# Patient Record
Sex: Male | Born: 1970 | Race: White | Hispanic: Yes | Marital: Married | Smoking: Never smoker
Health system: Southern US, Community
[De-identification: ages and names within clinical notes are randomized; demographics above are authoritative.]

## PROBLEM LIST (undated history)

## (undated) DIAGNOSIS — K219 Gastro-esophageal reflux disease without esophagitis: Secondary | ICD-10-CM

## (undated) DIAGNOSIS — K604 Rectal fistula, unspecified: Secondary | ICD-10-CM

## (undated) DIAGNOSIS — K851 Biliary acute pancreatitis without necrosis or infection: Secondary | ICD-10-CM

## (undated) DIAGNOSIS — Z8719 Personal history of other diseases of the digestive system: Secondary | ICD-10-CM

## (undated) DIAGNOSIS — K921 Melena: Secondary | ICD-10-CM

## (undated) DIAGNOSIS — J45909 Unspecified asthma, uncomplicated: Secondary | ICD-10-CM

## (undated) HISTORY — PX: TONSILLECTOMY: SUR1361

## (undated) HISTORY — DX: Melena: K92.1

## (undated) HISTORY — DX: Gastro-esophageal reflux disease without esophagitis: K21.9

## (undated) HISTORY — DX: Biliary acute pancreatitis without necrosis or infection: K85.10

## (undated) HISTORY — DX: Personal history of other diseases of the digestive system: Z87.19

## (undated) HISTORY — DX: Unspecified asthma, uncomplicated: J45.909

---

## 2003-10-01 DIAGNOSIS — Z8719 Personal history of other diseases of the digestive system: Secondary | ICD-10-CM

## 2003-10-01 HISTORY — DX: Personal history of other diseases of the digestive system: Z87.19

## 2009-04-18 ENCOUNTER — Emergency Department (HOSPITAL_COMMUNITY): Admission: EM | Admit: 2009-04-18 | Discharge: 2009-04-18 | Payer: Self-pay | Admitting: Emergency Medicine

## 2009-04-22 ENCOUNTER — Ambulatory Visit (HOSPITAL_COMMUNITY): Admission: RE | Admit: 2009-04-22 | Discharge: 2009-04-23 | Payer: Self-pay | Admitting: Orthopedic Surgery

## 2009-09-30 HISTORY — PX: WRIST FRACTURE SURGERY: SHX121

## 2010-08-09 LAB — TESTOSTERONE: Testosterone, total: 524.55

## 2010-08-09 LAB — TSH: TSH: 1.574

## 2010-08-09 LAB — CBC
HGB: 16.8 g/dL
WBC: 6.1
platelet count: 328

## 2011-01-06 LAB — CBC
HCT: 44.4 % (ref 39.0–52.0)
Hemoglobin: 15.8 g/dL (ref 13.0–17.0)
MCHC: 35.7 g/dL (ref 30.0–36.0)
RDW: 12.4 % (ref 11.5–15.5)

## 2011-02-12 NOTE — Op Note (Signed)
Marcus [name], Marcus [name] NO.:  [id number]   MEDICAL RECORD NO.:  [id number]          PATIENT TYPE:  OIB   LOCATION:  5004                         FACILITY:  [facility]   PHYSICIAN:  Dionne Ano. Gramig, M.D.DATE OF BIRTH:  [birth year]   DATE OF PROCEDURE:  04/22/2009  DATE OF DISCHARGE:                               OPERATIVE REPORT   PREOPERATIVE DIAGNOSIS:  Greater than three-part distal radius fracture,  left upper extremity with displacement.   POSTOPERATIVE DIAGNOSIS:  Greater than three-part distal radius  fracture, left upper extremity with displacement.   PROCEDURES:  1. Left wrist open reduction and internal fixation with distal radius      volar plate and screw construct.  2. Brachioradialis tenotomy.  3. Stress radiography.   SURGEON:  Dionne Ano. Amanda Pea, MD   ASSISTANT:  None.   COMPLICATIONS:  None.   ANESTHESIA:  General.   TOURNIQUET TIME:  Less than an hour.   DRAINS:  One.   ESTIMATED BLOOD LOSS:  Minimal.   INDICATIONS FOR PROCEDURE:  This patient is a pleasant male presents  with the above-mentioned diagnosis.  I have counseled him in regards to  risks and benefits of the surgery including risk of infection, bleeding,  anesthesia, damage to normal structures, and failure of surgery, to  accomplish its intended goals of relieving symptoms and restoring  function.  With this in mind, he decided to proceed.  All questions have  been encouraged and answered preoperatively.   OPERATIVE PROCEDURE:  The patient was seen by myself and Anesthesia,  taken to the operative suite, underwent smooth induction of anesthesia,  time-out was called, preop checklist accomplished.  He was prepped and  draped in the usual sterile fashion with Betadine scrub and paint about  the left upper extremity, this was a 10-minute scrub.  Once this was  done, outline marks were made, sterile field was secured, and a volar  radial approach to the wrist was made.   Following this, FCR tendon and  sheath was incised proximally and distally, and the patient then  underwent incision about the pronator quadratus, it was swept in a  radial to ulnar direction.  Fracture site was exposed, preliminarily  reduced, and following this, application of a DVR plate and screw  construct was accomplished.  I was able to achieve excellent radial  height, volar tilt and length without complicating feature.  The patient  tolerated this quite well.  A standard AO technique was used for the  placement.  Once this was done, the patient then underwent a very  careful and cautious irrigation and tourniquet deflation.  Excellent  hemostasis was obtained/secured with bipolar electrocautery as it was  during the initial dissection.  Pronator was repaired very nicely over  the plate.  I see a good robust tissue and a nice patulous sleeve.  Following this, TLS drain was placed and the wound was then closed in  layers of 3-0 Vicryl followed by a Prolene.  A 20-25 mL of Sensorcaine  was placed in the wound for postoperative analgesia.  Soft compartments,  no  complicating feature, excellent refill, and was placed in a short-arm  splint.  The distal radioulnar joint was identified and noted to be  stable.  The wrist underwent a full range of motion during the course of  the operation under live fluoro, and I did perform a sliding  brachioradialis tenotomy to make sure that the patient did not have any  deforming force against the radial styloid.   The patient will be admitted for IV antibiotics, general postop  observation, pain management.  Return to see me in the office in 10 days  after discharge for standard postop algorithm, status post distal radius  ORIF with plate and screw construct.  We will plan for removable splint  in 4 weeks, 8 weeks strengthening.  All questions have been encouraged  and answered.      Dionne Ano. Amanda Pea, M.D.  Electronically Signed      WMG/MEDQ  D:  04/22/2009  T:  04/22/2009  Job:  [id number]

## 2013-11-19 ENCOUNTER — Encounter: Payer: Self-pay | Admitting: Family Medicine

## 2013-11-19 ENCOUNTER — Ambulatory Visit (INDEPENDENT_AMBULATORY_CARE_PROVIDER_SITE_OTHER): Payer: 59 | Admitting: Family Medicine

## 2013-11-19 VITALS — BP 110/70 | HR 64 | Temp 98.0°F | Ht 67.5 in | Wt 165.8 lb

## 2013-11-19 DIAGNOSIS — K603 Anal fistula, unspecified: Secondary | ICD-10-CM

## 2013-11-19 DIAGNOSIS — Z23 Encounter for immunization: Secondary | ICD-10-CM

## 2013-11-19 DIAGNOSIS — Z0000 Encounter for general adult medical examination without abnormal findings: Secondary | ICD-10-CM

## 2013-11-19 DIAGNOSIS — Z8719 Personal history of other diseases of the digestive system: Secondary | ICD-10-CM | POA: Insufficient documentation

## 2013-11-19 LAB — LIPID PANEL
CHOLESTEROL: 176 mg/dL (ref 0–200)
HDL: 43.1 mg/dL (ref >39.00)
LDL CALC: 122 mg/dL — AB (ref 0–99)
Total CHOL/HDL Ratio: 4
Triglycerides: 56 mg/dL (ref 0.0–149.0)
VLDL: 11.2 mg/dL (ref 0.0–40.0)

## 2013-11-19 LAB — BASIC METABOLIC PANEL
BUN: 14 mg/dL (ref 6–23)
CALCIUM: 9.4 mg/dL (ref 8.4–10.5)
CO2: 25 mEq/L (ref 19–32)
CREATININE: 1 mg/dL (ref 0.4–1.5)
Chloride: 105 mEq/L (ref 96–112)
GFR: 92.74 mL/min (ref >60.00)
GLUCOSE: 85 mg/dL (ref 70–99)
Potassium: 3.9 mEq/L (ref 3.5–5.1)
Sodium: 139 mEq/L (ref 135–145)

## 2013-11-19 NOTE — Progress Notes (Signed)
BP 110/70  Pulse 64  Temp(Src) 98 F (36.7 C) (Oral)  Ht 5' 7.5" (1.715 m)  Wt 165 lb 12 oz (75.184 kg)  BMI 25.56 kg/m2   CC: new pt to establish  Subjective:    Patient ID: Marcus [name], male    DOB: [birth year], 43 y.o.   MRN: [id number]  HPI: Marcus [name] is a 43 y.o. male presenting on 11/19/2013 with Marcus [name] presents to establish today.  His wife and son see me as well.  Would like blood work today.  From [place], in [place] for last 7 years. Has stress at work.  Works with [place] about peri anal infection.  Ongoing for the last year.  Gets worse with sedentary activity - does lots of traveling (driving and flying).  Comes to a head and drains pus intermittently (2-3 times in last few months).  Preventative: Flu shot - today Tetanus - unsure but thinks within last 5 years  Spanish speaking, from [place] Lives with wife and 2 sons and [place] From [place], [place].   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 Allergies and medications reviewed and updated. No current outpatient prescriptions on file prior to visit.   No current facility-administered medications on file prior to visit.    Review of Systems  Constitutional: Negative for fever, chills, activity change, appetite change, fatigue and unexpected weight change.  HENT: Negative for hearing loss.   Eyes: Negative for visual disturbance.  Respiratory: Negative for cough, chest tightness, shortness of breath and wheezing.   Cardiovascular: Negative for chest pain, palpitations and leg swelling.  Gastrointestinal: Negative for nausea, vomiting, abdominal pain, diarrhea, constipation, blood in stool and abdominal distention.  Genitourinary: Negative for hematuria and difficulty urinating.    Musculoskeletal: Negative for arthralgias, myalgias and neck pain.  Skin: Negative for rash.  Neurological: Negative for dizziness, seizures, syncope and headaches.  Hematological: Negative for adenopathy. Does not bruise/bleed easily.  Psychiatric/Behavioral: Negative for dysphoric mood. The patient is nervous/anxious (due to work stress).    Per HPI unless specifically indicated above    Objective:    BP 110/70  Pulse 64  Temp(Src) 98 F (36.7 C) (Oral)  Ht 5' 7.5" (1.715 m)  Wt 165 lb 12 oz (75.184 kg)  BMI 25.56 kg/m2  Physical Exam  Nursing note and vitals reviewed. Constitutional: He is oriented to person, place, and time. He appears well-developed and well-nourished. No distress.  HENT:  Head: Normocephalic and atraumatic.  Right Ear: Hearing, tympanic membrane, external ear and ear canal normal.  Left Ear: Hearing, tympanic membrane, external ear and ear canal normal.  Nose: Nose normal.  Mouth/Throat: Uvula is midline, oropharynx is clear and moist and mucous membranes are normal. No oropharyngeal exudate, posterior oropharyngeal edema or posterior oropharyngeal erythema.  Eyes: Conjunctivae and EOM are normal. Pupils are equal, round, and reactive to light. No scleral icterus.  Neck: Normal range of motion. Neck supple.  Cardiovascular: Normal rate, regular rhythm, normal heart sounds and intact distal pulses.   No murmur heard. Pulses:      Radial pulses are 2+ on the right side, and 2+ on the left side.  Pulmonary/Chest: Effort normal and breath sounds normal. No respiratory distress. He has no wheezes. He has no rales.  Abdominal: Soft. Bowel sounds are normal. He exhibits no distension and no  mass. There is no tenderness. There is no rebound and no guarding.  Genitourinary: Rectal exam shows no external hemorrhoid and no fissure.  Posterior to anal ridge is small red papule ?angioma, inferior to this is small sinus tract opening, no drainage expressed, nontender to  palpation  Musculoskeletal: Normal range of motion. He exhibits no edema.  Lymphadenopathy:    He has no cervical adenopathy.  Neurological: He is alert and oriented to person, place, and time.  CN grossly intact, station and gait intact  Skin: Skin is warm and dry. No rash noted.  R thumb with several warts on palmar finger tip  Psychiatric: He has a normal mood and affect. His behavior is normal. Judgment and thought content normal.       Assessment & Plan:   Problem List Items Addressed This Visit   Health maintenance examination - Primary     Preventative protocols reviewed and updated unless pt declined. Discussed healthy diet and lifestyle. Flu shot today. Pt will check on immunization records at home and we will update as needed given his frequent travel for work. Pt to go to [place] this afternoon.    Relevant Orders      Lipid panel      Basic metabolic panel   History of duodenal ulcer     Controls with diet and prn prilosec otc.    Perianal fistula     No evidence of active infection/inflammation currently, but there is evidence of a sinus tract on exam today. Will refer to surg for further eval on non urgent basis.    Relevant Orders      Ambulatory referral to General Surgery       Follow up plan: Return in about 1 year (around 11/19/2014), or as needed, for physical.

## 2013-11-19 NOTE — Patient Instructions (Signed)
Pass by Marion's office to schedule eval with surgery in next few weeks (on return from [place]). Gusto verlo hoy.  llamenos con pregunta. Flu shot hoy. sangre para revisar colesterol y Immunologist. dejeme saber si parece que infeccion este regresando

## 2013-11-19 NOTE — Progress Notes (Signed)
Pre visit review using our clinic review tool, if applicable. No additional management support is needed unless otherwise documented below in the visit note. 

## 2013-11-19 NOTE — Assessment & Plan Note (Signed)
No evidence of active infection/inflammation currently, but there is evidence of a sinus tract on exam today. Will refer to surg for further eval on non urgent basis.

## 2013-11-19 NOTE — Assessment & Plan Note (Signed)
Controls with diet and prn prilosec otc.

## 2013-11-19 NOTE — Assessment & Plan Note (Signed)
Preventative protocols reviewed and updated unless pt declined. Discussed healthy diet and lifestyle. Flu shot today. Pt will check on immunization records at home and we will update as needed given his frequent travel for work. Pt to go to [place] this afternoon.

## 2013-11-22 ENCOUNTER — Encounter: Payer: Self-pay | Admitting: *Deleted

## 2013-12-15 ENCOUNTER — Ambulatory Visit (INDEPENDENT_AMBULATORY_CARE_PROVIDER_SITE_OTHER): Payer: 59 | Admitting: General Surgery

## 2013-12-15 ENCOUNTER — Encounter (INDEPENDENT_AMBULATORY_CARE_PROVIDER_SITE_OTHER): Payer: Self-pay | Admitting: General Surgery

## 2013-12-15 VITALS — BP 126/80 | HR 78 | Temp 98.6°F | Resp 14 | Ht 67.0 in | Wt 169.0 lb

## 2013-12-15 DIAGNOSIS — K603 Anal fistula, unspecified: Secondary | ICD-10-CM

## 2013-12-15 NOTE — Patient Instructions (Signed)

## 2013-12-15 NOTE — Progress Notes (Signed)
No chief complaint on file.   HISTORY: Marcus [name] is a 43 y.o. male who presents to the office with occasional perianal infections.  Other symptoms include anal pain.  This had been occurring for  8-9 months.  He has tried nothing in the past.  Nothing makes the symptoms worse.   It is intermittent in nature.  His bowel habits are regular and his bowel movements are soft.  His fiber intake is dietary.  He has never had a colonoscopy.     Past Medical History  Diagnosis Date  . Wrist fracture, left 2011  . History of duodenal ulcer 2005    worse with stress  . Asthma       Past Surgical History  Procedure Laterality Date  . Wrist fracture surgery  2011    hardware (pins)  . Tonsillectomy  1980 (?)        No current outpatient prescriptions on file.   No current facility-administered medications for this visit.      No Known Allergies    Family History  Problem Relation Age of Onset  . Diabetes Maternal Grandmother   . CAD Maternal Grandmother   . Cancer Neg Hx   . Stroke Neg Hx     History   Social History  . Marital Status: Married    Spouse Name: N/A    Number of Children: N/A  . Years of Education: N/A   Social History Main Topics  . Smoking status: Never Smoker   . Smokeless tobacco: Never Used  . Alcohol Use: Yes     Comment: Occasional  . Drug Use: No  . Sexual Activity: None   Other Topics Concern  . None   Social History Narrative   Spanish speaking, from [place]   Lives with wife and 2 sons and [place]   From [place], [place].     Occupation: works with [employer]   Edu: some college Administrator)   Activity: 2x/wk - trail running   Diet: good water, fruits/vegetables daily      [place] - PERTINENT POSITIVES ONLY: Review of Systems - General ROS: negative for - chills, fever or weight loss Hematological and Lymphatic ROS: negative for - bleeding problems, blood clots or bruising Respiratory ROS: no cough, shortness of  breath, or wheezing Cardiovascular ROS: no chest pain or dyspnea on exertion Gastrointestinal ROS: no abdominal pain, change in bowel habits, or black or bloody stools Genito-Urinary ROS: no dysuria, trouble voiding, or hematuria  EXAM: Filed Vitals:   12/15/13 0849  BP: 126/80  Pulse: 78  Temp: 98.6 F (37 C)  Resp: 14    General appearance: alert and cooperative Resp: clear to auscultation bilaterally Cardio: regular rate and rhythm GI: normal findings: soft, non-tender  Anal Exam Findings: posterior mass and possible external opening, DRE reveals a possible posterior mass in distal anal canal that could be internal opening    ASSESSMENT AND PLAN: Marcus [name] Is a 43 year old male who has signs and 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  I have recommended anal exam under anesthesia with possible fistulotomy versus seton placement. We discussed the risks of fistulotomy including minimal risk of anal incontinence. All questions were answered. I believe he understands the risk of the procedure and has agreed to proceed.    Rosario Adie, MD Colon and Rectal Surgery / [facility]      Visit Diagnoses: No diagnosis found.  Primary Care Physician: Garlon Hatchet  Danise Mina, MD

## 2014-01-04 ENCOUNTER — Ambulatory Visit (INDEPENDENT_AMBULATORY_CARE_PROVIDER_SITE_OTHER): Payer: 59 | Admitting: Family Medicine

## 2014-01-04 ENCOUNTER — Encounter: Payer: Self-pay | Admitting: Family Medicine

## 2014-01-04 VITALS — BP 98/62 | HR 52 | Temp 98.0°F | Wt 168.0 lb

## 2014-01-04 DIAGNOSIS — K8050 Calculus of bile duct without cholangitis or cholecystitis without obstruction: Secondary | ICD-10-CM

## 2014-01-04 DIAGNOSIS — K8020 Calculus of gallbladder without cholecystitis without obstruction: Secondary | ICD-10-CM

## 2014-01-04 DIAGNOSIS — K8010 Calculus of gallbladder with chronic cholecystitis without obstruction: Secondary | ICD-10-CM | POA: Insufficient documentation

## 2014-01-04 MED ORDER — OMEPRAZOLE 40 MG PO CPDR: 40.0000 mg | DELAYED_RELEASE_CAPSULE | Freq: Every day | ORAL | Status: DC

## 2014-01-04 NOTE — Progress Notes (Signed)
Pre visit review using our clinic review tool, if applicable. No additional management support is needed unless otherwise documented below in the visit note. 

## 2014-01-04 NOTE — Progress Notes (Signed)
   BP 98/62  Pulse 52  Temp(Src) 98 F (36.7 C) (Oral)  Wt 168 lb (76.204 kg)  SpO2 97%   CC: vomiting  Subjective:    Patient ID: Marcus Marcus [name], male    DOB: July 16, 1971, 43 y.o.   MRN: [id number]  HPI: Marcus Marcus [name] is a 42 y.o. male presenting on 01/04/2014 for Abdominal Pain   [name] presents today to discuss worsening abdominal discomfort associated with vomiting.  Increased stress at work.  Notes epigastric abdominal discomfort with increased stress.  Describes epigastric and some RUQ sharp crescendo/decrescendo discomfort over several hours.  Some regurgitation.  Has had 3 separate episodes of abdominal pain leading to nausea/vomiting - all at night.  Last episode had rice and fried fish prior.  Diet does have fried greasy foods.  Never really food related discomfort.  When he has abd pain flare, does change diet. No fever, jaundice, diarrhea, constipation.  He does use omeprazole OTC 20mg  and ranitidine 150mg  to improve abdominal discomfort.    Known h/o perirectal fistula has Marcus [name] colorectal surgeon, has not f/u with them for fistulogram.  Leaning towards not having surgery - worried about damage to sphincter. Known h/o duodenal ulcer dx by EGD (2000s in [place]) per pt tends to flare up with increased stress.  BP Readings from Last 3 Encounters:  01/04/14 98/62  12/15/13 126/80  11/19/13 110/70    Relevant past medical, surgical, family and social history reviewed and updated as indicated.  Allergies and medications reviewed and updated. No current outpatient prescriptions on file prior to visit.   No current facility-administered medications on file prior to visit.    Review of Systems Per HPI unless specifically indicated above    Objective:    BP 98/62  Pulse 52  Temp(Src) 98 F (36.7 C) (Oral)  Wt 168 lb (76.204 kg)  SpO2 97%  Physical Exam  Nursing note and vitals reviewed. Constitutional: He appears well-developed and well-nourished. No distress.  HENT:    Mouth/Throat: Oropharynx is clear and moist. No oropharyngeal exudate.  Cardiovascular: Normal rate, regular rhythm, normal heart sounds and intact distal pulses.   No murmur heard. Pulmonary/Chest: Effort normal and breath sounds normal. No respiratory distress. He has no wheezes. He has no rales.  Abdominal: Soft. Normal appearance and bowel sounds are normal. He exhibits no distension and no mass. There is no hepatosplenomegaly. There is no tenderness. There is no rigidity, no rebound, no guarding and negative Murphy's sign.  Skin: Skin is warm and dry. No rash noted.       Assessment & Plan:   Problem List Items Addressed This Visit   Biliary colic - Primary     Episodic RUQ and epigastric pain associated with nausea/vomiting sound consistent with biliary colic. Will obtain RUQ [place] and possibly refer to gen surgery.  Discussed possible blood work, possible HIDA scan if needed. Given h/o duodenal ulcer, will also start omerpazole 40mg  daily but sxs more consistent with biliary colic.    Relevant Orders      US Abdomen Limited RUQ       Follow up plan: Return as needed.

## 2014-01-04 NOTE — Patient Instructions (Addendum)
Suena que tiene problema de calculo en la vesicula biliar - sonograma ordenado hoy. Para posible ulcera - empieze omeprazole 40mg  diarios por 3 semanas y luego como lo necesite.  Clico biliar (Biliary Colic)  El clico biliar es un dolor continuo o irregular en la zona superior del abdomen. Generalmente se ubica debajo de la zona derecha de la caja torcica. Aparece cuando los clculos biliares interfieren con el flujo normal de la bilis que proviene de la vescula. La bilis es un lquido que interviene en la digestin de las [place]. Se produce en el hgado y se almacena en la vescula. Al comer, La bilis pasa desde la vescula, a travs del conducto cstico y el conducto biliar comn al intestino delgado. All se mezcla con la comida parcialmente digerida. Si un clculo obstruye alguno de esos conductos, se detiene el flujo normal de bilis. Las clulas del conducto biliar se contraen con fuerza para mover el clculo. Esto causa el dolor del clico biliar.  SNTOMAS  El paciente se queja de dolor en la zona superior del abdomen. El dolor puede ser:  En el centro de la zona superior del abdomen, justo por debajo del esternn.  En la zona superior derecha del abdomen, donde se encuentra la vescula biliar y el hgado.  Se expande hacia la espalda, hacia el omplato derecho.  Nuseas y vmitos  El dolor comienza generalmente despus de comer.  El clico biliar aparece como una demanda de bilis por parte del sistema digestivo. La demanda de bilis es mayor luego de ingerir alimentos ricos en grasas. Los sntomas tambin Geophysicist/field seismologist que han estado ayunando e ingieren abruptamente una comida abundante. La mayora de los episodios de clico biliar mejoran luego de 1 a 5 horas. Despus que se alivia el dolor ms intenso, podr seguir sintiendo un dolor moderado en el abdomen durante un lapso de 24 horas. DIAGNSTICO Luego de escuchar la descripcin de los sntomas, el mdico realizar  un examen fsico. Deber prestar atencin a la zona superior del abdomen. Esta es la zona en la que se encuentra el hgado y la vescula biliar. El mdico podr observar los clculos a travs de una ecografa. Tambin le realizaran un escaneo especializado de la vescula biliar. Le indicarn anlisis de [place], especialmente si tiene fiebre o el dolor persiste. PREVENCIN El clico biliar puede evitarse controlando los factores de riesgo que favorecen los clculos. Algunos de [employer] factores de riesgo como la herencia, el aumento de la edad y Water quality scientist son aspectos normales de la vida. La obesidad y [place] dieta rica en grasas son factores de riesgo que usted puede modificar a travs de cambios hacia un estilo de vida saludable. Las mujeres que atraviesan la menopausia y que reciben terapia de reemplazo hormonal (estrgenos) tambin tienen ms riesgo de Actor clicos biliares. TRATAMIENTO  Le prescribirn analgsicos.  Le indicarn una dieta sin grasas.  Si el primer episodio es intenso, o si los clicos aparecen nuevamente, generalmente se indica la ciruga para extirpar la vescula (colecistectoma). Este procedimiento puede realizarse a travs de pequeas incisiones utilizando un instrumento denominado laparoscopio. Muchas veces se requiere una breve estada en el hospital. Algunas personas reciben el alta el mismo da. Es el tratamiento ms ampliamente utilizado en personas que sufren dolor por clculos biliares. Es efectivo y seguro, no tiene complicaciones en ms del 90% de los [place].  Si la ciruga no puede llevarse a cabo, podrn utilizarse medicamentos para [employer] clculos. Esta medicacin es cara  y puede demorar meses o aos hasta que tenga [place]. Slo podr disolver clculos pequeos.  En algunos casos raros, se combinan estos medicamentos con un procedimiento denominado litotricia por ondas de choque. Este procedimiento [place] ondas de choque cuidadosamente dirigidas a romper los  clculos. En muchas personas tratadas con este procedimiento, los clculos vuelven a formarse luego de [employer]. PRONSTICO Si los clculos obstruyen el conducto cstico o conducto biliar comn, usted tiene el riesgo de sufrir episodios repetidos de clicos biliares. Tambin existe un 25% de probabilidades de desarrollar una infeccin de la vescula biliar (colecistitisaguda) o alguna otra complicacin en los siguientes 10 a 20 aos. Si ha sido sometido a [place], progrmela para el momento en que sea conveniente para usted, y para cuando no se encuentre enfermo. INSTRUCCIONES PARA EL CUIDADO DOMICILIARIO  Beba gran cantidad de lquidos claros.  Evite las comidas con mucha grasa o fritas, o cualquier alimento que empeore su dolor.  Tome los medicamentos como se le indic. SOLICITE ATENCIN MDICA SI:  Le sube la temperatura a ms de 100.5 F (38.1 C).  El dolor empeora con el [place].  Siente nuseas y [employer] comer y beber.  Tiene vmitos. SOLICITE ATENCIN MDICA DE INMEDIATO SI:  Siente dolor continuo e intenso en el abdomen, que no se alivia con medicamentos.  Siente nuseas y vmitos que no mejoran con medicamentos.  Tiene sntomas de clico biliar y comienza a Irene Shipper y escalofros. Esto puede ser un indicio de que ha desarrollado colecistitis. Comunquese con su mdico inmediatamente.  Su piel o la parte blanca del ojo se vuelven amarillas (ictericia). Document Released: 12/24/2007 Document Revised: 12/09/2011 [facility] Patient Information 2014 [place], [place].

## 2014-01-04 NOTE — Assessment & Plan Note (Signed)
Episodic RUQ and epigastric pain associated with nausea/vomiting sound consistent with biliary colic. Will obtain RUQ [place] and possibly refer to gen surgery.  Discussed possible blood work, possible HIDA scan if needed. Given h/o duodenal ulcer, will also start omerpazole 40mg  daily but sxs more consistent with biliary colic.

## 2014-01-10 ENCOUNTER — Ambulatory Visit: Payer: Self-pay | Admitting: Family Medicine

## 2014-01-10 ENCOUNTER — Encounter: Payer: Self-pay | Admitting: Family Medicine

## 2014-01-10 ENCOUNTER — Other Ambulatory Visit: Payer: Self-pay | Admitting: Family Medicine

## 2014-01-10 DIAGNOSIS — K8020 Calculus of gallbladder without cholecystitis without obstruction: Secondary | ICD-10-CM

## 2014-01-10 DIAGNOSIS — K8050 Calculus of bile duct without cholangitis or cholecystitis without obstruction: Secondary | ICD-10-CM

## 2014-01-14 ENCOUNTER — Emergency Department: Payer: Self-pay | Admitting: Emergency Medicine

## 2014-01-14 LAB — COMPREHENSIVE METABOLIC PANEL
ALBUMIN: 4.2 g/dL (ref 3.4–5.0)
ALT: 73 U/L (ref 12–78)
Alkaline Phosphatase: 77 U/L
Anion Gap: 6 — ABNORMAL LOW (ref 7–16)
BUN: 14 mg/dL (ref 7–18)
Bilirubin,Total: 0.5 mg/dL (ref 0.2–1.0)
CO2: 27 mmol/L (ref 21–32)
Calcium, Total: 8.6 mg/dL (ref 8.5–10.1)
Chloride: 106 mmol/L (ref 98–107)
Creatinine: 1 mg/dL (ref 0.60–1.30)
Glucose: 124 mg/dL — ABNORMAL HIGH (ref 65–99)
Osmolality: 279 (ref 275–301)
Potassium: 3.4 mmol/L — ABNORMAL LOW (ref 3.5–5.1)
SGOT(AST): 92 U/L — ABNORMAL HIGH (ref 15–37)
SODIUM: 139 mmol/L (ref 136–145)
Total Protein: 7.8 g/dL (ref 6.4–8.2)

## 2014-01-14 LAB — URINALYSIS, COMPLETE
BACTERIA: NONE SEEN
Bilirubin,UR: NEGATIVE
Blood: NEGATIVE
GLUCOSE, UR: NEGATIVE mg/dL (ref 0–75)
Ketone: NEGATIVE
Leukocyte Esterase: NEGATIVE
NITRITE: NEGATIVE
Ph: 6 (ref 4.5–8.0)
Protein: NEGATIVE
RBC,UR: NONE SEEN /HPF (ref 0–5)
Specific Gravity: 1.026 (ref 1.003–1.030)
Squamous Epithelial: NONE SEEN
WBC UR: <1 /HPF (ref 0–5)

## 2014-01-14 LAB — CBC WITH DIFFERENTIAL/PLATELET
BASOS ABS: 0 10*3/uL (ref 0.0–0.1)
BASOS PCT: 0.6 %
Eosinophil #: 0 10*3/uL (ref 0.0–0.7)
Eosinophil %: 0.8 %
HCT: 45.9 % (ref 40.0–52.0)
HGB: 15.4 g/dL (ref 13.0–18.0)
Lymphocyte #: 1.4 10*3/uL (ref 1.0–3.6)
Lymphocyte %: 25.6 %
MCH: 30.4 pg (ref 26.0–34.0)
MCHC: 33.6 g/dL (ref 32.0–36.0)
MCV: 91 fL (ref 80–100)
Monocyte #: 0.4 x10 3/mm (ref 0.2–1.0)
Monocyte %: 6.8 %
Neutrophil #: 3.7 10*3/uL (ref 1.4–6.5)
Neutrophil %: 66.2 %
Platelet: 245 10*3/uL (ref 150–440)
RBC: 5.06 10*6/uL (ref 4.40–5.90)
RDW: 12.6 % (ref 11.5–14.5)
WBC: 5.6 10*3/uL (ref 3.8–10.6)

## 2014-01-14 LAB — LIPASE, BLOOD: LIPASE: 244 U/L (ref 73–393)

## 2014-01-17 ENCOUNTER — Encounter: Payer: Self-pay | Admitting: *Deleted

## 2014-01-17 ENCOUNTER — Ambulatory Visit (INDEPENDENT_AMBULATORY_CARE_PROVIDER_SITE_OTHER): Payer: 59 | Admitting: Surgery

## 2014-01-17 ENCOUNTER — Telehealth (INDEPENDENT_AMBULATORY_CARE_PROVIDER_SITE_OTHER): Payer: Self-pay | Admitting: Surgery

## 2014-01-17 ENCOUNTER — Encounter (INDEPENDENT_AMBULATORY_CARE_PROVIDER_SITE_OTHER): Payer: Self-pay | Admitting: Surgery

## 2014-01-17 VITALS — BP 104/70 | HR 80 | Temp 97.6°F | Resp 14 | Ht 66.0 in | Wt 163.0 lb

## 2014-01-17 DIAGNOSIS — K8010 Calculus of gallbladder with chronic cholecystitis without obstruction: Secondary | ICD-10-CM

## 2014-01-17 DIAGNOSIS — Z8719 Personal history of other diseases of the digestive system: Secondary | ICD-10-CM

## 2014-01-17 DIAGNOSIS — K603 Anal fistula, unspecified: Secondary | ICD-10-CM

## 2014-01-17 NOTE — Patient Instructions (Signed)
Please consider the recommendations that we have given you today:  Consider surgery to laparoscopically remove your gallbladder for your recurrent episodes of gallbladder attacks.  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  See the Handout(s) we have given you.  Please call our office at [phone] if you wish to schedule surgery or if you have further questions / concerns.   Cholecystitis Cholecystitis is an inflammation of your gallbladder. It is usually caused by a buildup of gallstones or sludge (cholelithiasis) in your gallbladder. The gallbladder stores a fluid that helps digest fats (bile). Cholecystitis is serious and needs treatment right away.  CAUSES   Gallstones. Gallstones can block the tube that leads to your gallbladder, causing bile to build up. As bile builds up, the gallbladder becomes inflamed.  Bile duct problems, such as blockage from scarring or kinking.  Tumors. Tumors can stop bile from leaving your gallbladder correctly, causing bile to build up. As bile builds up, the gallbladder becomes inflamed. SYMPTOMS   Nausea.  Vomiting.  Abdominal pain, especially in the upper right area of your abdomen.  Abdominal tenderness or bloating.  Sweating.  Chills.  Fever.  Yellowing of the skin and the whites of the eyes (jaundice). DIAGNOSIS  Your caregiver may order blood tests to look for infection or gallbladder problems. Your caregiver may also order imaging tests, such as an ultrasound or computed tomography (CT) scan. Further tests may include a hepatobiliary iminodiacetic acid (HIDA) scan. This scan allows your caregiver to see your bile move from the liver to the gallbladder and to the small intestine. TREATMENT  A hospital stay is usually necessary to lessen the inflammation of your gallbladder. You may be required to not eat or drink (fast) for a certain amount of time. You may be given medicine to treat pain or an antibiotic  medicine to treat an infection. Surgery may be needed to remove your gallbladder (cholecystectomy) once the inflammation has gone down. Surgery may be needed right away if you develop complications such as death of gallbladder tissue (gangrene) or a tear (perforation) of the gallbladder.  [place] care will depend on your treatment. In general:  If you were given antibiotics, take them as directed. Finish them even if you start to feel better.  Only take over-the-counter or prescription medicines for pain, discomfort, or fever as directed by your caregiver.  Follow a low-fat diet until you see your caregiver again.  Keep all follow-up visits as directed by your caregiver. SEEK IMMEDIATE MEDICAL CARE IF:   Your pain is increasing and not controlled by medicines.  Your pain moves to another part of your abdomen or to your back.  You have a fever.  You have nausea and vomiting. MAKE SURE YOU:  Understand these instructions.  Will watch your condition.  Will get help right away if you are not doing well or get worse. Document Released: 09/16/2005 Document Revised: 12/09/2011 Document Reviewed: 08/02/2011 [facility] Patient Information 2014 [place], [place].  Anal Fistula An anal fistula is an abnormal tunnel that develops between the bowel and skin near the outside of the anus, where feces comes out. The anus has a number of tiny glands that make lubricating fluid. Sometimes these glands can become plugged and infected. This may lead to the development of a fluid-filled pocket (abscess). An anal fistula often develops after this infection or abscess. It is nearly always caused by a past or current anal abscess.  CAUSES  Though  an anal fistula is almost always caused by a past or current anal abscess, other causes can include:  A complication of surgery.  Trauma to the rectal area.  Radiation to the area.  Other medical conditions or diseases, such as:    Chronic inflammatory bowel disease, such as Crohn disease or ulcerative colitis.   Colon or rectal cancer.   Diverticular disease, such as diverticulitis.   A sexually transmitted disease, such as gonorrhea, chlamydia, or syphilis.  An HIV infection or AIDS.  SYMPTOMS   Throbbing or constant pain that may be worse when sitting.   Swelling or irritation around the anus.   Drainage of pus or blood from an opening near the anus.   Pain with bowel movements.  Fever or chills. DIAGNOSIS  Your caregiver will examine the area to find the openings of the anal fistula and the fistula tract. The external opening of the anal fistula may be seen during a physical examination. Other examinations that may be performed include:   Examination of the rectal area with a gloved hand (digital rectal exam).   Examination with a probe or scope to help locate the internal opening of the fistula.   Injection of a dye into the fistula opening. X-rays can be taken to find the exact location and path of the fistula.   An MRI or ultrasound of the anal area.  Other tests may be performed to find the cause of the anal fistula.   TREATMENT  The most common treatment for an anal fistula is surgery. There are different surgery options depending on where your fistula is located and how complex the fistula is. Surgical options include:  A fistulotomy. This surgery involves opening up the whole fistula and draining the contents inside to promote healing.  Seton placement. A silk string (seton) is placed into the fistula during a fistulotomy to drain any infection to promote healing.  Advancement flap procedure. Tissue is removed from your rectum or the skin around the anus and is attached to the opening of the fistula.  Bioprosthetic plug. A cone-shaped plug is made from your tissue and is used to block the opening of the fistula. Some anal fistulas do not require surgery. A fibrin glue is a  non-surgical option that involves injecting the glue to seal the fistula. You also may be prescribed an antibiotic medicine to treat an infection.  HOME CARE INSTRUCTIONS   Take your antibiotics as directed. Finish them even if you start to feel better.  Only take over-the-counter or prescription medicines as directed by your caregiver.Use a stool softener or laxative, if recommended.   Eat a high-fiber diet to help avoid constipation or as directed by your caregiver.  Drink enough water to keep your urine clear or pale yellow.   A warm sitz bath may be soothing and help with healing. You may take warm sitz baths for 15 20 minutes, 3 4 times a day to ease pain and discomfort.   Follow excellent hygiene to keep the anal area as clean and dry as possible. Use wet toilet paper or moist towelettes after each bowel movement.  SEEK MEDICAL CARE IF: You have increased pain not controlled with medicines.  SEEK IMMEDIATE MEDICAL CARE IF:  You have severe, intolerable pain.  You have new swelling, redness, or discharge around the anal area.  You have tenderness or warmth around the anal area.  You have chills or diarrhea.  You have severe problems urinating or having a bowel  movement.   You have a fever or persistent symptoms for more than 2 3 days.   You have a fever and your symptoms suddenly get worse.  MAKE SURE YOU:   Understand these instructions.  Will watch your condition.  Will get help right away if you are not doing well or get worse. Document Released: 08/29/2008 Document Revised: 09/02/2012 Document Reviewed: 07/22/2011 [facility] Patient Information 2014 [place].  ANORECTAL SURGERY: POST OP INSTRUCTIONS  1. Take your usually prescribed home medications unless otherwise directed. 2. DIET: Follow a light bland diet the first 24 hours after arrival home, such as soup, liquids, crackers, etc.  Be sure to include lots of fluids daily.  Avoid fast food or heavy  meals as your are more likely to get nauseated.  Eat a low fat the next few days after surgery.   3. PAIN CONTROL: a. Pain is best controlled by a usual combination of three different methods TOGETHER: i. Ice/Heat ii. Over the counter pain medication iii. Prescription pain medication b. Most patients will experience some swelling and discomfort in the anus/rectal area. and incisions.  Ice packs or heat (30-60 minutes up to 6 times a day) will help. Use ice for the first few days to help decrease swelling and bruising, then switch to heat such as warm towels, sitz baths, warm baths, etc to help relax tight/sore spots and speed recovery.  Some people prefer to use ice alone, heat alone, alternating between ice & heat.  Experiment to what works for you.  Swelling and bruising can take several weeks to resolve.   c. It is helpful to take an over-the-counter pain medication regularly for the first few weeks.  Choose one of the following that works best for you: i. Naproxen (Aleve, etc)  Two 220mg  tabs twice a day ii. Ibuprofen (Advil, etc) Three 200mg  tabs four times a day (every meal & bedtime) iii. Acetaminophen (Tylenol, etc) 500-650mg  four times a day (every meal & bedtime) d. A  prescription for pain medication (such as oxycodone, hydrocodone, etc) should be given to you upon discharge.  Take your pain medication as prescribed.  i. If you are having problems/concerns with the prescription medicine (does not control pain, nausea, vomiting, rash, itching, etc), please call us [phone] to see if we need to switch you to a different pain medicine that will work better for you and/or control your side effect better. ii. If you need a refill on your pain medication, please contact your pharmacy.  They will contact our office to request authorization. Prescriptions will not be filled after 5 pm or on week-ends.  Use a Sitz Bath 4-8 times a day for relief A sitz bath is a warm water bath taken in the  sitting position that covers only the hips and buttocks. It may be used for either healing or hygiene purposes. Sitz baths are also used to relieve pain, itching, or muscle spasms. The water may contain medicine. Moist heat will help you heal and relax.  HOME CARE INSTRUCTIONS  Take 3 to 4 sitz baths a day. 1. Fill the bathtub half full with warm water. 2. Sit in the water and open the drain a little. 3. Turn on the warm water to keep the tub half full. Keep the water running constantly. 4. Soak in the water for 15 to 20 minutes. 5. After the sitz bath, pat the affected area dry first. SEEK MEDICAL CARE IF:  You get worse instead of better. Stop  the sitz baths if you get worse.   4. KEEP YOUR BOWELS REGULAR a. The goal is one bowel movement a day b. Avoid getting constipated.  Between the surgery and the pain medications, it is common to experience some constipation.  Increasing fluid intake and taking a fiber supplement (such as Metamucil, Citrucel, FiberCon, MiraLax, etc) 1-2 times a day regularly will usually help prevent this problem from occurring.  A mild laxative (prune juice, Milk of Magnesia, MiraLax, etc) should be taken according to package directions if there are no bowel movements after 48 hours. c. Watch out for diarrhea.  If you have many loose bowel movements, simplify your diet to bland foods & liquids for a few days.  Stop any stool softeners and decrease your fiber supplement.  Switching to mild anti-diarrheal medications (Kayopectate, Pepto Bismol) can help.  If this worsens or does not improve, please call us.  5. Wound Care a. Remove your bandages the day after surgery.  Unless discharge instructions indicate otherwise, leave your bandage dry and in place overnight.  Remove the bandage during your first bowel movement.   b. Allow the wound packing to fall out over the next few days.  You can trim exposed gauze / ribbon as it falls out.  You do not need to repack the wound  unless instructed otherwise.  Wear an absorbent pad or soft cotton gauze in your underwear as needed to catch any drainage and help keep the area  c. Keep the area clean and dry.  Bathe / shower every day.  Keep the area clean by showering / bathing over the incision / wound.   It is okay to soak an open wound to help wash it.  Wet wipes or showers / gentle washing after bowel movements is often less traumatic than regular toilet paper. d. Dennis Bast may have some styrofoam-like soft packing in the rectum which will come out with the first bowel movement.  e. You will often notice bleeding with bowel movements.  This should slow down by the end of the first week of surgery f. Expect some drainage.  This should slow down, too, by the end of the first week of surgery.  Wear an absorbent pad or soft cotton gauze in your underwear until the drainage stops. 6. ACTIVITIES as tolerated:   a. You may resume regular (light) daily activities beginning the next day-such as daily self-care, walking, climbing stairs-gradually increasing activities as tolerated.  If you can walk 30 minutes without difficulty, it is safe to try more intense activity such as jogging, treadmill, bicycling, low-impact aerobics, swimming, etc. b. Save the most intensive and strenuous activity for last such as sit-ups, heavy lifting, contact sports, etc  Refrain from any heavy lifting or straining until you are off narcotics for pain control.   c. DO NOT PUSH THROUGH PAIN.  Let pain be your guide: If it hurts to do something, don't do it.  Pain is your body warning you to avoid that activity for another week until the pain goes down. d. You may drive when you are no longer taking prescription pain medication, you can comfortably sit for long periods of time, and you can safely maneuver your car and apply brakes. e. Dennis Bast may have sexual intercourse when it is comfortable.  7. FOLLOW UP in our office a. Please call [facility] at [phone] to set up an  appointment to see your surgeon in the office for a follow-up appointment approximately 2 weeks after  your surgery. b. Make sure that you call for this appointment the day you arrive home to insure a convenient appointment time. 10. IF YOU HAVE DISABILITY OR FAMILY LEAVE FORMS, BRING THEM TO THE OFFICE FOR PROCESSING.  DO NOT GIVE THEM TO YOUR DOCTOR.        WHEN TO CALL us [phone]: 1. Poor pain control 2. Reactions / problems with new medications (rash/itching, nausea, etc)  3. Fever over 101.5 F (38.5 C) 4. Inability to urinate 5. Nausea and/or vomiting 6. Worsening swelling or bruising 7. Continued bleeding from incision. 8. Increased pain, redness, or drainage from the incision  The clinic staff is available to answer your questions during regular business hours (8:30am-5pm).  Please don't hesitate to call and ask to speak to one of our nurses for clinical concerns.   A surgeon from [facility] is always on call at the hospitals   If you have a medical emergency, go to the nearest emergency room or call 911.    [facility], [place], [place], [place], [place]  [address] ? MAIN: [phone] ? TOLL FREE: [phone] ? FAX [phone] [email or web address]   LAPAROSCOPIC SURGERY: POST OP INSTRUCTIONS  8. DIET: Follow a light bland diet the first 24 hours after arrival home, such as soup, liquids, crackers, etc.  Be sure to include lots of fluids daily.  Avoid fast food or heavy meals as your are more likely to get nauseated.  Eat a low fat the next few days after surgery.   9. Take your usually prescribed home medications unless otherwise directed. 10. PAIN CONTROL: a. Pain is best controlled by a usual combination of three different methods TOGETHER: i. Ice/Heat ii. Over the counter pain medication iii. Prescription pain medication b. Most patients will experience some swelling and bruising around the  incisions.  Ice packs or heating pads (30-60 minutes up to 6 times a day) will help. Use ice for the first few days to help decrease swelling and bruising, then switch to heat to help relax tight/sore spots and speed recovery.  Some people prefer to use ice alone, heat alone, alternating between ice & heat.  Experiment to what works for you.  Swelling and bruising can take several weeks to resolve.   c. It is helpful to take an over-the-counter pain medication regularly for the first few weeks.  Choose one of the following that works best for you: i. Naproxen (Aleve, etc)  Two 220mg  tabs twice a day ii. Ibuprofen (Advil, etc) Three 200mg  tabs four times a day (every meal & bedtime) iii. Acetaminophen (Tylenol, etc) 500-650mg  four times a day (every meal & bedtime) d. A  prescription for pain medication (such as oxycodone, hydrocodone, etc) should be given to you upon discharge.  Take your pain medication as prescribed.  i. If you are having problems/concerns with the prescription medicine (does not control pain, nausea, vomiting, rash, itching, etc), please call us [phone] to see if we need to switch you to a different pain medicine that will work better for you and/or control your side effect better. ii. If you need a refill on your pain medication, please contact your pharmacy.  They will contact our office to request authorization. Prescriptions will not be filled after 5 pm or on week-ends. 11. Avoid getting constipated.  Between the surgery and the pain medications, it is common to experience some constipation.  Increasing fluid intake and taking a fiber supplement (such  as Metamucil, Citrucel, FiberCon, MiraLax, etc) 1-2 times a day regularly will usually help prevent this problem from occurring.  A mild laxative (prune juice, Milk of Magnesia, MiraLax, etc) should be taken according to package directions if there are no bowel movements after 48 hours.   12. Watch out for diarrhea.  If you have  many loose bowel movements, simplify your diet to bland foods & liquids for a few days.  Stop any stool softeners and decrease your fiber supplement.  Switching to mild anti-diarrheal medications (Kayopectate, Pepto Bismol) can help.  If this worsens or does not improve, please call us. [place] / shower every day.  You may shower over the dressings as they are waterproof.  Continue to shower over incision(s) after the dressing is off. 14. Remove your waterproof bandages 5 days after surgery.  You may leave the incision open to air.  You may replace a dressing/Band-Aid to cover the incision for comfort if you wish.  15. ACTIVITIES as tolerated:   a. You may resume regular (light) daily activities beginning the next day-such as daily self-care, walking, climbing stairs-gradually increasing activities as tolerated.  If you can walk 30 minutes without difficulty, it is safe to try more intense activity such as jogging, treadmill, bicycling, low-impact aerobics, swimming, etc. b. Save the most intensive and strenuous activity for last such as sit-ups, heavy lifting, contact sports, etc  Refrain from any heavy lifting or straining until you are off narcotics for pain control.   c. DO NOT PUSH THROUGH PAIN.  Let pain be your guide: If it hurts to do something, don't do it.  Pain is your body warning you to avoid that activity for another week until the pain goes down. d. You may drive when you are no longer taking prescription pain medication, you can comfortably wear a seatbelt, and you can safely maneuver your car and apply brakes. e. Dennis Bast may have sexual intercourse when it is comfortable.  16. FOLLOW UP in our office a. Please call [facility] at [phone] to set up an appointment to see your surgeon in the office for a follow-up appointment approximately 2-3 weeks after your surgery. b. Make sure that you call for this appointment the day you arrive home to insure a convenient appointment time. 10. IF YOU  HAVE DISABILITY OR FAMILY LEAVE FORMS, BRING THEM TO THE OFFICE FOR PROCESSING.  DO NOT GIVE THEM TO YOUR DOCTOR.   WHEN TO CALL us [phone]: 9. Poor pain control 10. Reactions / problems with new medications (rash/itching, nausea, etc)  11. Fever over 101.5 F (38.5 C) 12. Inability to urinate 13. Nausea and/or vomiting 14. Worsening swelling or bruising 15. Continued bleeding from incision. 16. Increased pain, redness, or drainage from the incision   The clinic staff is available to answer your questions during regular business hours (8:30am-5pm).  Please don't hesitate to call and ask to speak to one of our nurses for clinical concerns.   If you have a medical emergency, go to the nearest emergency room or call 911.  A surgeon from [facility] is always on call at the [facility], [place], [place], [place], [place]  [address] ? MAIN: [phone] ? TOLL FREE: [phone] ?  FAX [phone] [email or web address]

## 2014-01-17 NOTE — Telephone Encounter (Signed)
Orders on file needs to discuss deposit

## 2014-01-17 NOTE — Progress Notes (Signed)
Subjective:     Patient ID: Marcus [name], male   DOB: 05-30-1971, 43 y.o.   MRN: [id number]  HPI  Note: This dictation was prepared with Dragon/digital dictation along with [facility] technology. Any transcriptional errors that result from this process are unintentional.       Marcus [name]  Mar 17, 1971 [id number]  Patient Care Team: Ria Bush, MD as PCP - General (Family Medicine)  This patient is a 43 y.o.male who presents today for surgical evaluation at the request of Dr. Danise Mina  Reason for visit: Postprandial abdominal pain and nausea with gallstones.  Pleasant but anxious male.  Seen last month by her group to consider treatment of perirectal fistula.  Operative exploration recommended.  He is hesitating.  No incontinence to flatus or stool.  No current infection now.  Patient notes at least 5 episodes of abdominal pain.  Usually after eating a heavy meal.  Upper abdomen but mainly right-sided.  Occasionally radiating to back shoulder.  He had one episode of emesis.  Most time he gets nauseated.  He tried to switch to a low-fat diet but still struggles with pain.  He had one attack concerning enough to go to the emergency room over and [place] [facility].  His wife was concerned and had him see his primary care physician.  Ultrasounds have been done concerning for gallstones.  He does have a history of heartburn and reflux that omeprazole usually manages.  That has not stop these attacks and these symptoms are different from that.  Based on concerned, surgical consultation requested.    No personal nor family history of GI/colon cancer, inflammatory bowel disease, irritable bowel syndrome, allergy such as Celiac Sprue, dietary/dairy problems, colitis, nor gastritis.  He recalls being told he had a duodenal ulcer in the past.  He claims his job is very stressful.  No recent sick contacts/gastroenteritis.  No travel outside the country.  No changes in diet.  No dysphagia to solids  or liquids.  No significant heartburn or reflux.  No hematochezia, hematemesis, coffee ground emesis.  No evidence of prior gastric/peptic ulceration. He claims he is a "social drinker".  Sounds like no more than 2 beers a night.  No evidence of binge drinking or hepatitis or pancreatitis.  He is normally relatively active.   Patient Active Problem List   Diagnosis Date Noted  . Chronic cholecystitis with calculus 01/04/2014  . 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 Health maintenance examination 11/19/2013  . History of duodenal ulcer     Past Medical History  Diagnosis Date  . Wrist fracture, left 2011  . History of duodenal ulcer 2005    worse with stress  . Asthma     Past Surgical History  Procedure Laterality Date  . Wrist fracture surgery  2011    hardware (pins)  . Tonsillectomy  1980 (?)    History   Social History  . Marital Status: Married    Spouse Name: N/A    Number of Children: N/A  . Years of Education: N/A   Occupational History  . Not on file.   Social History Main Topics  . Smoking status: Never Smoker   . Smokeless tobacco: Never Used  . Alcohol Use: Yes     Comment: Occasional  . Drug Use: No  . Sexual Activity: Not on file   Other Topics Concern  . Not on file   Social History Narrative   Spanish speaking, from [place]   Lives with wife and 2 sons and  [place]   From [place], [place].     Occupation: works with [employer]   Edu: some college Administrator)   Activity: 2x/wk - trail running   Diet: good water, fruits/vegetables daily    Family History  Problem Relation Age of Onset  . Diabetes Maternal Grandmother   . CAD Maternal Grandmother   . Cancer Neg Hx   . Stroke Neg Hx     Current Outpatient Prescriptions  Medication Sig Dispense Refill  . omeprazole (PRILOSEC) 40 MG capsule Take 1 capsule (40 mg total) by mouth daily.  30 capsule  3  . ondansetron (ZOFRAN-ODT) 4 MG disintegrating tablet       .  oxyCODONE-acetaminophen (PERCOCET/ROXICET) 5-325 MG per tablet        No current facility-administered medications for this visit.     No Known Allergies  BP 104/70  Pulse 80  Temp(Src) 97.6 F (36.4 C) (Temporal)  Resp 14  Ht 5\' 6"  (1.676 m)  Wt 163 lb (73.936 kg)  BMI 26.32 kg/m2  No results found.   Review of Systems  Constitutional: Negative for fever, chills and diaphoresis.  HENT: Negative for ear discharge, facial swelling, mouth sores, nosebleeds, sore throat and trouble swallowing.   Eyes: Negative for photophobia, discharge and visual disturbance.  Respiratory: Negative for choking, chest tightness, shortness of breath and stridor.   Cardiovascular: Negative for chest pain and palpitations.  Gastrointestinal: Negative for nausea, vomiting, abdominal pain, diarrhea, constipation, blood in stool, abdominal distention, anal bleeding and rectal pain.  Endocrine: Negative for cold intolerance and heat intolerance.  Genitourinary: Negative for dysuria, urgency, difficulty urinating and testicular pain.  Musculoskeletal: Negative for arthralgias, back pain, gait problem and myalgias.  Skin: Negative for color change, pallor, rash and wound.  Allergic/Immunologic: Negative for environmental allergies and food allergies.  Neurological: Negative for dizziness, speech difficulty, weakness, numbness and headaches.  Hematological: Negative for adenopathy. Does not bruise/bleed easily.  Psychiatric/Behavioral: Negative for hallucinations, confusion and agitation.       Objective:   Physical Exam  Constitutional: He is oriented to person, place, and time. He appears well-developed and well-nourished. No distress.  HENT:  Head: Normocephalic.  Mouth/Throat: Oropharynx is clear and moist. No oropharyngeal exudate.  Eyes: Conjunctivae and EOM are normal. Pupils are equal, round, and reactive to light. No scleral icterus.  Neck: Normal range of motion. Neck supple. No tracheal  deviation present.  Cardiovascular: Normal rate, regular rhythm and intact distal pulses.   Pulmonary/Chest: Effort normal and breath sounds normal. No respiratory distress.  Abdominal: Soft. He exhibits no distension. There is no rigidity, no rebound, no guarding, no tenderness at McBurney's point and negative Murphy's sign. No hernia. Hernia confirmed negative in the ventral area, confirmed negative in the right inguinal area and confirmed negative in the left inguinal area.  Mild right upper quadrant discomfort  Genitourinary:  Deferred per patient request since had examination last month.  Musculoskeletal: Normal range of motion. He exhibits no tenderness.  Lymphadenopathy:    He has no cervical adenopathy.       Right: No inguinal adenopathy present.       Left: No inguinal adenopathy present.  Neurological: He is alert and oriented to person, place, and time. No cranial nerve deficit. He exhibits normal muscle tone. Coordination normal.  Skin: Skin is warm and dry. No rash noted. He is not diaphoretic. No erythema. No pallor.  Psychiatric: He has a normal mood and affect. His behavior is normal. Judgment and thought  content normal.       Assessment:     Classic story of biliary colic with repeated attacks.  Suspicious for chronic cholecystitis.  Persistent perianal fistula.     Plan:     I spent a long time reviewing things with the patient and his wife numerous times.  I think he would benefit from cholecystectomy to remove his gallbladder.  He has had at least 5 attacks with a classic story.  The rest of the differential diagnosis seems unlikely.  He was concern about risks of surgery of surgery that he read online.  He was hoping he can just avoid surgery if he states a low-fat diet.  His wife reminded him that he still has had issues:  The anatomy & physiology of hepatobiliary & pancreatic function was discussed.  The pathophysiology of gallbladder dysfunction was discussed.   Natural history risks without surgery was discussed.   I feel the risks of no intervention will lead to serious problems that outweigh the operative risks; therefore, I recommended cholecystectomy to remove the pathology.  I explained laparoscopic techniques with possible need for an open approach.  Probable cholangiogram to evaluate the bilary tract was explained as well.    Risks such as bleeding, infection, abscess, leak, injury to other organs, need for further treatment, heart attack, death, and other risks were discussed.  I noted a good likelihood this will help address the problem.  Possibility that this will not correct all abdominal symptoms was explained.  Goals of post-operative recovery were discussed as well.  We will work to minimize complications.  An educational handout further explaining the pathology and treatment options was given as well.  Questions were answered.  The patient expresses understanding & wishes to proceed with surgery.  While he is asleep, I offerred to do examination under anesthesia and address his chronic fistula.  This could be a combined case.  He been recommended to have a fistulotomy last month. I discussed with Dr. Marcello Moores who saw him for this.  The patient is afraid of fecal incontinence.  I think that risk is low in a young male with history of Crohn's & no incontinence issues in the past.  I cautioned him that the fistula would not heal without an operation.  Hopefully a superficial fistulotomy only needed but may need LIFT operation.  Possible need for a seton.  Sometimes it seemed like he wanted to get it all done at once.  Other times he was hesitant to have any surgery done.  His wife seems more convinced he needs an operation to fix things.  I did discuss with surgery would entail:  The anatomy & physiology of the anorectal region was discussed.  We discussed the pathophysiology of anorectal abscess and fistula.  Differential diagnosis was discussed.  Natural  history progression was discussed.   I stressed the importance of a bowel regimen to have daily soft bowel movements to minimize progression of disease.     The patient's condition is not adequately controlled.  Non-operative treatment has not healed the fistula.  Therefore, I recommended examination under anaesthesia to confirm the diagnosis and treat the fistula.  I discussed techniques that may be required such as fistulotomy, ligation by LIFT technique, and/or seton placement.  Benefits & alternatives discussed.  I noted a good likelihood this will help address the problem, but sometimes repeat operations and prolonged healing times may occur.  Risks such as bleeding, pain, recurrence, reoperation, incontinence, heart attack, death, and  other risks were discussed.      Educational handouts further explaining the pathology, treatment options, and bowel regimen were given.  The patient expressed understanding & wishes to proceed.  We will work to coordinate surgery for a mutually convenient time.

## 2014-01-20 ENCOUNTER — Telehealth: Payer: Self-pay | Admitting: Family Medicine

## 2014-01-20 ENCOUNTER — Encounter: Payer: Self-pay | Admitting: *Deleted

## 2014-01-20 NOTE — Telephone Encounter (Signed)
Patient walked in to ask me to help find him a Licensed conveyancer that wont make him pay the professional fees before his gallbladder surgery can be done. He saw [facility] and they wanted $1000 up front before they would set up his surgery. I set him up with Dr Bary Castilla at [facility] in [place] and they will let him  be on a payment plan which is all that the patient wants to be able to do. Appt made with patient here and Dr Bary Castilla is on Epic.

## 2014-01-23 NOTE — Telephone Encounter (Signed)
Noted. Thanks.

## 2014-02-15 ENCOUNTER — Ambulatory Visit: Payer: Self-pay | Admitting: General Surgery

## 2014-03-02 ENCOUNTER — Encounter: Payer: Self-pay | Admitting: *Deleted

## 2014-04-29 ENCOUNTER — Encounter: Payer: Self-pay | Admitting: Family Medicine

## 2014-04-29 ENCOUNTER — Ambulatory Visit (INDEPENDENT_AMBULATORY_CARE_PROVIDER_SITE_OTHER): Payer: 59 | Admitting: Family Medicine

## 2014-04-29 VITALS — BP 110/70 | HR 72 | Temp 98.5°F | Wt 162.2 lb

## 2014-04-29 DIAGNOSIS — M5430 Sciatica, unspecified side: Secondary | ICD-10-CM

## 2014-04-29 DIAGNOSIS — M5432 Sciatica, left side: Secondary | ICD-10-CM

## 2014-04-29 MED ORDER — DICLOFENAC SODIUM 75 MG PO TBEC: 75.0000 mg | DELAYED_RELEASE_TABLET | Freq: Two times a day (BID) | ORAL | Status: DC

## 2014-04-29 NOTE — Progress Notes (Signed)
Pre visit review using our clinic review tool, if applicable. No additional management support is needed unless otherwise documented below in the visit note. 

## 2014-04-29 NOTE — Progress Notes (Signed)
[place] [address] Phone: [phone] Fax: [phone]  Patient ID: Marcus [name] MRN: [id number], DOB: 11-08-1970, 43 y.o. Date of Encounter: 04/29/2014  Primary Physician:  Ria Bush, MD   Chief Complaint: Leg Pain   Subjective:   History of Present Illness:  Marcus [name] is a 43 y.o. very pleasant male patient who presents with the following:  Has been having some buttocks pain for 2 weeks. Pressure washed house 2 - 2 1/2 weeks ago. Feels when standing up.  All posterior. It is essential illness entirely in the buttocks region, and it does not extend down the front or posterior aspect of the patient's leg. He is not having any numbness or tingling. He has no significant back history problems. He is trying to be minimal at home with the exception of doing some occasional mild stretching.  Past Medical History, Surgical History, Social History, Family History, Problem List, Medications, and Allergies have been reviewed and updated if relevant.  Review of Systems:  GEN: No acute illnesses, no fevers, chills. GI: No n/v/d, eating normally Pulm: No SOB Interactive and getting along well at home.  Otherwise, ROS is as per the HPI.  Objective:   Physical Examination: BP 110/70  Pulse 72  Temp(Src) 98.5 F (36.9 C) (Oral)  Wt 162 lb 4 oz (73.596 kg)   GEN: WDWN, NAD, Non-toxic, Alert & Oriented x 3 HEENT: Atraumatic, Normocephalic.  Ears and Nose: No external deformity. EXTR: No clubbing/cyanosis/edema NEURO: Normal gait.  PSYCH: Normally interactive. Conversant. Not depressed or anxious appearing.  Calm demeanor.   Range of motion at  the waist: Flexion: normal Extension: normal Lateral bending: normal Rotation: all normal  No echymosis or edema Rises to examination table with no difficulty Gait: non antalgic  Inspection/Deformity: N Paraspinus Tenderness: none TTP in buttocks  B Ankle Dorsiflexion (L5,4): 5/5 B Great Toe  Dorsiflexion (L5,4): 5/5 Heel Walk (L5): WNL Toe Walk (S1): WNL Rise/Squat (L4): WNL  SENSORY B Medial Foot (L4): WNL B Dorsum (L5): WNL B Lateral (S1): WNL Light Touch: WNL Pinprick: WNL  REFLEXES Knee (L4): 2+ Ankle (S1): 2+  B SLR, seated: neg B SLR, supine: neg B FABER: neg B Reverse FABER: neg B Greater Troch: NT B Log Roll: neg B Stork: NT B Sciatic Notch: NT   Laboratory and Imaging Data:  Assessment & Plan:   Back pain with left-sided sciatica  Not classic sciatica but suspect referred to buttocks. Piriformis spasm, too. A rehabilitation program from the [employer] was reviewed with the patient face to face for their condition.   ROM, str for hip and LBP  No red flags. Approx 95% of these cases will be better by 6 weeks.  New Prescriptions   DICLOFENAC (VOLTAREN) 75 MG EC TABLET    Take 1 tablet (75 mg total) by mouth 2 (two) times daily.   Modified Medications   Modified Medication Previous Medication   OMEPRAZOLE (PRILOSEC) 40 MG CAPSULE omeprazole (PRILOSEC) 40 MG capsule      Take 40 mg by mouth daily as needed.    Take 1 capsule (40 mg total) by mouth daily.   Signed,  Maud Deed. Elodie Panameno, MD, CA[facility] Sports Medicine   Discontinued Medications   No medications on file   Current Medications at Discharge:   Medication List       This list is accurate as of: 04/29/14 11:59 PM.  Always use your most recent med list.  diclofenac 75 MG EC tablet  Commonly known as:  VOLTAREN  Take 1 tablet (75 mg total) by mouth 2 (two) times daily.     omeprazole 40 MG capsule  Commonly known as:  PRILOSEC  Take 40 mg by mouth daily as needed.     ondansetron 4 MG disintegrating tablet  Commonly known as:  ZOFRAN-ODT     oxyCODONE-acetaminophen 5-325 MG per tablet  Commonly known as:  PERCOCET/ROXICET

## 2014-06-07 ENCOUNTER — Emergency Department (HOSPITAL_COMMUNITY): Payer: 59

## 2014-06-07 ENCOUNTER — Encounter (HOSPITAL_COMMUNITY): Payer: Self-pay | Admitting: Emergency Medicine

## 2014-06-07 ENCOUNTER — Inpatient Hospital Stay (HOSPITAL_COMMUNITY)
Admission: EM | Admit: 2014-06-07 | Discharge: 2014-06-10 | DRG: 417 | Disposition: A | Discharge status: Home / Self Care | Payer: 59 | Attending: Internal Medicine | Admitting: Internal Medicine

## 2014-06-07 DIAGNOSIS — K859 Acute pancreatitis without necrosis or infection, unspecified: Secondary | ICD-10-CM | POA: Diagnosis present

## 2014-06-07 DIAGNOSIS — J45909 Unspecified asthma, uncomplicated: Secondary | ICD-10-CM

## 2014-06-07 DIAGNOSIS — K851 Biliary acute pancreatitis without necrosis or infection: Secondary | ICD-10-CM

## 2014-06-07 DIAGNOSIS — R7402 Elevation of levels of lactic acid dehydrogenase (LDH): Secondary | ICD-10-CM

## 2014-06-07 DIAGNOSIS — K8040 Calculus of bile duct with cholecystitis, unspecified, without obstruction: Principal | ICD-10-CM | POA: Diagnosis present

## 2014-06-07 DIAGNOSIS — Z8719 Personal history of other diseases of the digestive system: Secondary | ICD-10-CM

## 2014-06-07 DIAGNOSIS — K8050 Calculus of bile duct without cholangitis or cholecystitis without obstruction: Secondary | ICD-10-CM

## 2014-06-07 DIAGNOSIS — Z8711 Personal history of peptic ulcer disease: Secondary | ICD-10-CM | POA: Diagnosis not present

## 2014-06-07 DIAGNOSIS — R740 Nonspecific elevation of levels of transaminase and lactic acid dehydrogenase [LDH]: Secondary | ICD-10-CM

## 2014-06-07 DIAGNOSIS — R932 Abnormal findings on diagnostic imaging of liver and biliary tract: Secondary | ICD-10-CM

## 2014-06-07 DIAGNOSIS — K8010 Calculus of gallbladder with chronic cholecystitis without obstruction: Secondary | ICD-10-CM

## 2014-06-07 HISTORY — DX: Biliary acute pancreatitis without necrosis or infection: K85.10

## 2014-06-07 LAB — LIPASE, BLOOD: Lipase: >3000 U/L — ABNORMAL HIGH (ref 11–59)

## 2014-06-07 LAB — CBC WITH DIFFERENTIAL/PLATELET
BASOS PCT: 0 % (ref 0–1)
Basophils Absolute: 0 10*3/uL (ref 0.0–0.1)
EOS ABS: 0 10*3/uL (ref 0.0–0.7)
Eosinophils Relative: 0 % (ref 0–5)
HEMATOCRIT: 45.4 % (ref 39.0–52.0)
HEMOGLOBIN: 16.5 g/dL (ref 13.0–17.0)
LYMPHS ABS: 0.9 10*3/uL (ref 0.7–4.0)
Lymphocytes Relative: 7 % — ABNORMAL LOW (ref 12–46)
MCH: 31 pg (ref 26.0–34.0)
MCHC: 36.3 g/dL — AB (ref 30.0–36.0)
MCV: 85.3 fL (ref 78.0–100.0)
MONO ABS: 0.7 10*3/uL (ref 0.1–1.0)
MONOS PCT: 6 % (ref 3–12)
NEUTROS PCT: 87 % — AB (ref 43–77)
Neutro Abs: 10.3 10*3/uL — ABNORMAL HIGH (ref 1.7–7.7)
Platelets: 252 10*3/uL (ref 150–400)
RBC: 5.32 MIL/uL (ref 4.22–5.81)
RDW: 12 % (ref 11.5–15.5)
WBC: 11.9 10*3/uL — ABNORMAL HIGH (ref 4.0–10.5)

## 2014-06-07 LAB — URINALYSIS, ROUTINE W REFLEX MICROSCOPIC
Glucose, UA: NEGATIVE mg/dL
HGB URINE DIPSTICK: NEGATIVE
Ketones, ur: 15 mg/dL — AB
NITRITE: NEGATIVE
PROTEIN: NEGATIVE mg/dL
Specific Gravity, Urine: 1.02 (ref 1.005–1.030)
UROBILINOGEN UA: 1 mg/dL (ref 0.0–1.0)
pH: 7.5 (ref 5.0–8.0)

## 2014-06-07 LAB — COMPREHENSIVE METABOLIC PANEL
ALT: 640 U/L — ABNORMAL HIGH (ref 0–53)
ANION GAP: 17 — AB (ref 5–15)
AST: 390 U/L — ABNORMAL HIGH (ref 0–37)
Albumin: 4.3 g/dL (ref 3.5–5.2)
Alkaline Phosphatase: 180 U/L — ABNORMAL HIGH (ref 39–117)
BILIRUBIN TOTAL: 3 mg/dL — AB (ref 0.3–1.2)
BUN: 11 mg/dL (ref 6–23)
CHLORIDE: 96 meq/L (ref 96–112)
CO2: 22 mEq/L (ref 19–32)
Calcium: 9.6 mg/dL (ref 8.4–10.5)
Creatinine, Ser: 0.98 mg/dL (ref 0.50–1.35)
GFR calc non Af Amer: >90 mL/min (ref >90)
Glucose, Bld: 187 mg/dL — ABNORMAL HIGH (ref 70–99)
Potassium: 3.7 mEq/L (ref 3.7–5.3)
Sodium: 135 mEq/L — ABNORMAL LOW (ref 137–147)
Total Protein: 7.5 g/dL (ref 6.0–8.3)

## 2014-06-07 LAB — PROTIME-INR
INR: 1.07 (ref 0.00–1.49)
Prothrombin Time: 13.9 seconds (ref 11.6–15.2)

## 2014-06-07 LAB — APTT: aPTT: 27 seconds (ref 24–37)

## 2014-06-07 LAB — URINE MICROSCOPIC-ADD ON

## 2014-06-07 MED ORDER — SODIUM CHLORIDE 0.9 % IJ SOLN
3.0000 mL | INTRAMUSCULAR | Status: DC | PRN
Start: 2014-06-07 — End: 2014-06-10

## 2014-06-07 MED ORDER — HEPARIN SODIUM (PORCINE) 5000 UNIT/ML IJ SOLN
5000.0000 [IU] | Freq: Three times a day (TID) | INTRAMUSCULAR | Status: DC
  Administered 2014-06-07 – 2014-06-10 (×8): 5000 [IU] via SUBCUTANEOUS
  Filled 2014-06-07 (×11): qty 1

## 2014-06-07 MED ORDER — SODIUM CHLORIDE 0.9 % IV BOLUS (SEPSIS)
1000.0000 mL | Freq: Once | INTRAVENOUS | Status: AC
  Administered 2014-06-07: 1000 mL via INTRAVENOUS

## 2014-06-07 MED ORDER — SODIUM CHLORIDE 0.9 % IV SOLN
INTRAVENOUS | Status: DC
  Administered 2014-06-07 – 2014-06-08 (×4): via INTRAVENOUS

## 2014-06-07 MED ORDER — FENTANYL CITRATE 0.05 MG/ML IJ SOLN
50.0000 ug | Freq: Once | INTRAMUSCULAR | Status: AC
  Administered 2014-06-07: 50 ug via INTRAVENOUS
  Filled 2014-06-07: qty 2

## 2014-06-07 MED ORDER — ONDANSETRON HCL 4 MG/2ML IJ SOLN: 4.0000 mg | Freq: Four times a day (QID) | INTRAMUSCULAR | Status: DC | PRN

## 2014-06-07 MED ORDER — HYDROMORPHONE HCL PF 1 MG/ML IJ SOLN
1.0000 mg | Freq: Once | INTRAMUSCULAR | Status: AC
  Administered 2014-06-07: 1 mg via INTRAVENOUS
  Filled 2014-06-07: qty 1

## 2014-06-07 MED ORDER — SODIUM CHLORIDE 0.9 % IV SOLN: 250.0000 mL | INTRAVENOUS | Status: DC | PRN

## 2014-06-07 MED ORDER — SODIUM CHLORIDE 0.9 % IJ SOLN
3.0000 mL | Freq: Two times a day (BID) | INTRAMUSCULAR | Status: DC
  Administered 2014-06-07 – 2014-06-08 (×2): 3 mL via INTRAVENOUS

## 2014-06-07 MED ORDER — MORPHINE SULFATE 2 MG/ML IJ SOLN: 2.0000 mg | INTRAMUSCULAR | Status: DC | PRN

## 2014-06-07 MED ORDER — SODIUM CHLORIDE 0.9 % IV SOLN
INTRAVENOUS | Status: AC
  Administered 2014-06-07: 20:00:00 via INTRAVENOUS

## 2014-06-07 MED ORDER — ONDANSETRON HCL 4 MG/2ML IJ SOLN
4.0000 mg | Freq: Once | INTRAMUSCULAR | Status: AC
  Administered 2014-06-07: 4 mg via INTRAVENOUS
  Filled 2014-06-07: qty 2

## 2014-06-07 NOTE — ED Provider Notes (Signed)
CSN: [id number]     Arrival date & time 06/07/14  1602 History   First MD Initiated Contact with Patient 06/07/14 1701     Chief Complaint  Patient presents with  . Abdominal Pain     (Consider location/radiation/quality/duration/timing/severity/associated sxs/prior Treatment) Patient is a 43 y.o. male presenting with abdominal pain. The history is provided by the patient. No language interpreter was used.  Abdominal Pain Pain location:  Periumbilical and RUQ Pain quality: aching and sharp   Pain severity:  Moderate Onset quality:  Gradual Duration:  2 days Progression:  Waxing and waning Associated symptoms: nausea and vomiting   Associated symptoms: no chills, no cough, no diarrhea, no fever and no shortness of breath   Associated symptoms comment:  Patient with a known history of gall stones has had RUQ and periumbilical pain for the past 2 days. No fever. He has nausea with non-bloody vomiting. No chest pain or cough. He denies change in bowel movements.   Past Medical History  Diagnosis Date  . Wrist fracture, left 2011  . History of duodenal ulcer 2005    worse with stress  . Asthma    Past Surgical History  Procedure Laterality Date  . Wrist fracture surgery  2011    hardware (pins)  . Tonsillectomy  1980 (?)   Family History  Problem Relation Age of Onset  . Diabetes Maternal Grandmother   . CAD Maternal Grandmother   . Cancer Neg Hx   . Stroke Neg Hx    History  Substance Use Topics  . Smoking status: Never Smoker   . Smokeless tobacco: Never Used  . Alcohol Use: Yes     Comment: Occasional    Review of Systems  Constitutional: Negative for fever and chills.  Respiratory: Negative.  Negative for cough and shortness of breath.   Cardiovascular: Negative.   Gastrointestinal: Positive for nausea, vomiting and abdominal pain. Negative for diarrhea.  Musculoskeletal: Negative.   Skin: Negative.   Neurological: Negative.       Allergies  Review of  patient's allergies indicates no known allergies.  Home Medications   Prior to Admission medications   Medication Sig Start Date End Date Taking? Authorizing Provider  ondansetron (ZOFRAN-ODT) 4 MG disintegrating tablet Take 4 mg by mouth every 8 (eight) hours as needed for nausea.  01/14/14  Yes Historical Provider, MD  oxyCODONE-acetaminophen (PERCOCET/ROXICET) 5-325 MG per tablet Take 1 tablet by mouth every 4 (four) hours as needed for moderate pain.  01/14/14  Yes Historical Provider, MD   BP 121/87  Pulse 88  Temp(Src) 98.6 F (37 C) (Oral)  Resp 18  SpO2 100% Physical Exam  Constitutional: He is oriented to person, place, and time. He appears well-developed and well-nourished.  HENT:  Head: Normocephalic.  Neck: Normal range of motion. Neck supple.  Cardiovascular: Normal rate and regular rhythm.   Pulmonary/Chest: Effort normal and breath sounds normal.  Abdominal: Soft. Bowel sounds are normal. There is tenderness. There is guarding. There is no rebound.  RUQ tenderness to soft abdomen. Mildly distended. No peritoneal signs.   Musculoskeletal: Normal range of motion.  Neurological: He is alert and oriented to person, place, and time.  Skin: Skin is warm and dry. No rash noted.  Psychiatric: He has a normal mood and affect.    ED Course  Procedures (including critical care time) Labs Review Labs Reviewed  CBC WITH DIFFERENTIAL - Abnormal; Notable for the following:    WBC 11.9 (*)  MCHC 36.3 (*)    Neutrophils Relative % 87 (*)    Neutro Abs 10.3 (*)    Lymphocytes Relative 7 (*)    All other components within normal limits  COMPREHENSIVE METABOLIC PANEL - Abnormal; Notable for the following:    Sodium 135 (*)    Glucose, Bld 187 (*)    AST 390 (*)    ALT 640 (*)    Alkaline Phosphatase 180 (*)    Total Bilirubin 3.0 (*)    Anion gap 17 (*)    All other components within normal limits  LIPASE, BLOOD - Abnormal; Notable for the following:    Lipase >3000 (*)     All other components within normal limits  URINALYSIS, ROUTINE W REFLEX MICROSCOPIC   Results for orders placed during the hospital encounter of 06/07/14  CBC WITH DIFFERENTIAL      Result Value Ref Range   WBC 11.9 (*) 4.0 - 10.5 K/uL   RBC 5.32  4.22 - 5.81 MIL/uL   Hemoglobin 16.5  13.0 - 17.0 g/dL   HCT 45.4  39.0 - 52.0 %   MCV 85.3  78.0 - 100.0 fL   MCH 31.0  26.0 - 34.0 pg   MCHC 36.3 (*) 30.0 - 36.0 g/dL   RDW 12.0  11.5 - 15.5 %   Platelets 252  150 - 400 K/uL   Neutrophils Relative % 87 (*) 43 - 77 %   Neutro Abs 10.3 (*) 1.7 - 7.7 K/uL   Lymphocytes Relative 7 (*) 12 - 46 %   Lymphs Abs 0.9  0.7 - 4.0 K/uL   Monocytes Relative 6  3 - 12 %   Monocytes Absolute 0.7  0.1 - 1.0 K/uL   Eosinophils Relative 0  0 - 5 %   Eosinophils Absolute 0.0  0.0 - 0.7 K/uL   Basophils Relative 0  0 - 1 %   Basophils Absolute 0.0  0.0 - 0.1 K/uL  COMPREHENSIVE METABOLIC PANEL      Result Value Ref Range   Sodium 135 (*) 137 - 147 mEq/L   Potassium 3.7  3.7 - 5.3 mEq/L   Chloride 96  96 - 112 mEq/L   CO2 22  19 - 32 mEq/L   Glucose, Bld 187 (*) 70 - 99 mg/dL   BUN 11  6 - 23 mg/dL   Creatinine, Ser 0.98  0.50 - 1.35 mg/dL   Calcium 9.6  8.4 - 10.5 mg/dL   Total Protein 7.5  6.0 - 8.3 g/dL   Albumin 4.3  3.5 - 5.2 g/dL   AST 390 (*) 0 - 37 U/L   ALT 640 (*) 0 - 53 U/L   Alkaline Phosphatase 180 (*) 39 - 117 U/L   Total Bilirubin 3.0 (*) 0.3 - 1.2 mg/dL   GFR calc non Af Amer >90  >90 mL/min   GFR calc Af Amer >90  >90 mL/min   Anion gap 17 (*) 5 - 15  LIPASE, BLOOD      Result Value Ref Range   Lipase >3000 (*) 11 - 59 U/L  URINALYSIS, ROUTINE W REFLEX MICROSCOPIC      Result Value Ref Range   Color, Urine AMBER (*) YELLOW   APPearance CLEAR  CLEAR   Specific Gravity, Urine 1.020  1.005 - 1.030   pH 7.5  5.0 - 8.0   Glucose, UA NEGATIVE  NEGATIVE mg/dL   Hgb urine dipstick NEGATIVE  NEGATIVE   Bilirubin Urine SMALL (*)  NEGATIVE   Ketones, ur 15 (*) NEGATIVE mg/dL    Protein, ur NEGATIVE  NEGATIVE mg/dL   Urobilinogen, UA 1.0  0.0 - 1.0 mg/dL   Nitrite NEGATIVE  NEGATIVE   Leukocytes, UA TRACE (*) NEGATIVE  PROTIME-INR      Result Value Ref Range   Prothrombin Time 13.9  11.6 - 15.2 seconds   INR 1.07  0.00 - 1.49  APTT      Result Value Ref Range   aPTT 27  24 - 37 seconds  URINE MICROSCOPIC-ADD ON      Result Value Ref Range   Squamous Epithelial / LPF RARE  RARE   WBC, UA 0-2  <3 WBC/hpf   Bacteria, UA RARE  RARE   Urine-Other MUCOUS PRESENT     US Abdomen Complete  06/07/2014   CLINICAL DATA:  Abdomen pain, nausea and vomiting.  EXAM: ULTRASOUND ABDOMEN COMPLETE  COMPARISON:  None.  FINDINGS: Gallbladder:  Gallstones are noted the gallbladder. No wall thickening visualized. No sonographic Murphy sign noted.  Common bile duct:  Diameter: 4.5 mm  Liver:  No focal lesion identified. Within normal limits in parenchymal echogenicity.  IVC:  No abnormality visualized.  Pancreas:  Visualized portion unremarkable.  Spleen:  Size and appearance within normal limits.  Right Kidney:  Length: 12.1 cm. Echogenicity within normal limits. No mass or hydronephrosis visualized.  Left Kidney:  Length: 12.8 cm. Echogenicity within normal limits. No mass or hydronephrosis visualized.  Abdominal aorta:  No aneurysm visualized.  Other findings:  None.  IMPRESSION: Cholelithiasis without sonographic evidence of acute cholecystitis. No acute abnormality.   Electronically Signed   By: Abelardo Diesel M.D.   On: 06/07/2014 19:04   Imaging Review No results found.   EKG Interpretation None      MDM   Final diagnoses:  None    1. Gall stone pancreatitis  Pain is managed, no vomiting.   Labs resulted show lipase >3000, elevated transaminases, with history of gall stones. Discussed with [facility] who accepts for admission. Patient is stable.     Dewaine Oats, PA-C 06/08/14 [phone]

## 2014-06-07 NOTE — ED Notes (Signed)
Pt to US at this time.

## 2014-06-07 NOTE — ED Notes (Signed)
Pt here with central abdominal pain since afternoon with N/V. Reports hx of gall stones and states this pain is the same. Pt noted to be very anxious in triage. States he took Percocet with no relief. Pt denies CP, SOB.

## 2014-06-07 NOTE — H&P (Signed)
[facility] History and Physical  Dagoberto Nealy DQQ:[id number] DOB: [birth year] DOA: 06/07/2014  Referring physician: ED physician PCP: Ria Bush, MD  Specialists:   Chief Complaint: Nausea, vomiting and abdominal pain.  HPI: Marcus [name] is a 43 y.o. male, with a past medical history of recurrent abdominal pain due to gallstone, who presents with nausea vomiting and abdominal pain.   The patient reports that he had recurrent abdominal pain due to gallstone. Her first episode was 6 months ago when he was working in [place]. Then he had another episode 4 months ago. He was seen by surgeon, Dr. Johney Maine. He is in the process of being scheduled for a surgery, but obviously has not been done yet. 2 days ago, he started having nausea, vomiting and abdominal pain again. He vomited several times without blood in the vomitus. His abdominal pain is located at the epigastric area, 10 out of 10 in severity, constant, stabbing-like pain. No radiation. It is aggravated by eating food. He took some Percocet at home which gave him some relief. He does not have fever, chills or diarrhea. Denies any symptoms for UTI. He did not notice any blood in his stool or urine.  US-abdomen was done in ED, which showed cholelithiasis without sonographic evidence of acute cholecystitis. His lipase is elevated, at >3000. He has mildly elevated WBC 11.9.  Review of Systems: As presented in the history of presenting illness, rest negative.  Where does patient live?  Lives with his wife at home. Can patient participate in ADLs? Yes  Allergy: No Known Allergies  Past Medical History  Diagnosis Date  . Wrist fracture, left 2011  . History of duodenal ulcer 2005    worse with stress  . Asthma     Past Surgical History  Procedure Laterality Date  . Wrist fracture surgery  2011    hardware (pins)  . Tonsillectomy  1980 (?)    Social History:  reports that he has never smoked. He has never used smokeless  tobacco. He reports that he drinks alcohol. He reports that he does not use illicit drugs.  Family History:  Family History  Problem Relation Age of Onset  . Diabetes Maternal Grandmother   . CAD Maternal Grandmother   . Cancer Neg Hx   . Stroke Neg Hx      Prior to Admission medications   Medication Sig Start Date End Date Taking? Authorizing Provider  ondansetron (ZOFRAN-ODT) 4 MG disintegrating tablet Take 4 mg by mouth every 8 (eight) hours as needed for nausea.  01/14/14  Yes Historical Provider, MD  oxyCODONE-acetaminophen (PERCOCET/ROXICET) 5-325 MG per tablet Take 1 tablet by mouth every 4 (four) hours as needed for moderate pain.  01/14/14  Yes Historical Provider, MD    Physical Exam: Filed Vitals:   06/07/14 1915 06/07/14 1945 06/07/14 2000 06/07/14 2026  BP: 102/70 127/72 143/74 124/69  Pulse: 62 66 77 65  Temp:    98 F (36.7 C)  TempSrc:    Oral  Resp:    19  Height:    5\' 6"  (1.676 m)  Weight:    73.6 kg (162 lb 4.1 oz)  SpO2: 98% 100% 95% 99%   General: Not in acute distress HEENT:       Eyes: PERRL, EOMI, no scleral icterus       ENT: No discharge from the ears and nose, no pharynx injection, no tonsillar enlargement.        Neck: No JVD, no bruit, no  mass felt. Cardiac: S1/S2, RRR, No murmurs, gallops or rubs Pulm: Good air movement bilaterally. Clear to auscultation bilaterally. No rales, wheezing, rhonchi or rubs. Abd: Soft, nondistended, has tenderness over the epigastric area.  no rebound pain, no organomegaly, BS present Ext: No edema. 2+DP/PT pulse bilaterally Musculoskeletal: No joint deformities, erythema, or stiffness, ROM full Skin: No rashes.  Neuro: Alert and oriented X3, cranial nerves II-XII grossly intact, muscle strength 5/5 in all extremeties, sensation to light touch intact.  Psych: Patient is not psychotic, no suicidal or hemocidal ideation.  Labs on Admission:  Basic Metabolic Panel:  Recent Labs Lab 06/07/14 1624  NA 135*  K 3.7   CL 96  CO2 22  GLUCOSE 187*  BUN 11  CREATININE 0.98  CALCIUM 9.6   Liver Function Tests:  Recent Labs Lab 06/07/14 1624  AST 390*  ALT 640*  ALKPHOS 180*  BILITOT 3.0*  PROT 7.5  ALBUMIN 4.3    Recent Labs Lab 06/07/14 1624  LIPASE >3000*   No results found for this basename: AMMONIA,  in the last 168 hours CBC:  Recent Labs Lab 06/07/14 1624  WBC 11.9*  NEUTROABS 10.3*  HGB 16.5  HCT 45.4  MCV 85.3  PLT 252   Cardiac Enzymes: No results found for this basename: CKTOTAL, CKMB, CKMBINDEX, TROPONINI,  in the last 168 hours  BNP (last 3 results) No results found for this basename: PROBNP,  in the last 8760 hours CBG: No results found for this basename: GLUCAP,  in the last 168 hours  Radiological Exams on Admission: US Abdomen Complete  06/07/2014   CLINICAL DATA:  Abdomen pain, nausea and vomiting.  EXAM: ULTRASOUND ABDOMEN COMPLETE  COMPARISON:  None.  FINDINGS: Gallbladder:  Gallstones are noted the gallbladder. No wall thickening visualized. No sonographic Murphy sign noted.  Common bile duct:  Diameter: 4.5 mm  Liver:  No focal lesion identified. Within normal limits in parenchymal echogenicity.  IVC:  No abnormality visualized.  Pancreas:  Visualized portion unremarkable.  Spleen:  Size and appearance within normal limits.  Right Kidney:  Length: 12.1 cm. Echogenicity within normal limits. No mass or hydronephrosis visualized.  Left Kidney:  Length: 12.8 cm. Echogenicity within normal limits. No mass or hydronephrosis visualized.  Abdominal aorta:  No aneurysm visualized.  Other findings:  None.  IMPRESSION: Cholelithiasis without sonographic evidence of acute cholecystitis. No acute abnormality.   Electronically Signed   By: Abelardo Diesel M.D.   On: 06/07/2014 19:04    EKG: Independently reviewed. Sinus rhythm, regular, normal axis, normal R wave progression, normal QT interval, No ischemic change in T waves or ST segments.  Assessment/Plan Principal  Problem:   Pancreatitis, gallstone Active Problems:   History of duodenal ulcer   Asthma  1. Pancreatitis secondary to gallstone: Patient has a history of gallstone which has not been removed yet. On this admission his lipase is elevated to more than 3000. He has been evaluated by the surgeon 4 month ago, and is supposed to a surgery, but not done yet. Currently patient is a hemodynamically stable. He has mild elevated WBC at 11.9, but no fever or chills. Abdominal ultrasound did not show cholecystitis.  - will admit to med-surg bed - NPO - IVF: NS 125 cc/h - pain control with morphine prn - EKG - INR and PTT - may consult to surgeon in AM - AM lab: CMP and CBC  2. History of duodenal ulcer: No signs of GI bleeding. Patient takes PPI intermittently at  home. - Oral Protonix  3. Hx of Asthma: Not on any medications. Patient does not have any wheezing on auscultation. No signs of excess of the - Will serve closely  DVT ppx: SQ Heparin   Code Status: Full code Family Communication:  Yes, patient's wife at bed side Disposition Plan: Admit to inpatient  Ivor Costa [facility] Pager [phone]  If 7PM-7AM, please contact night-coverage [email or web address] Password [email or web address] 06/08/2014, 12:00 AM  \

## 2014-06-07 NOTE — ED Notes (Signed)
Attempted report 

## 2014-06-08 DIAGNOSIS — J45909 Unspecified asthma, uncomplicated: Secondary | ICD-10-CM

## 2014-06-08 DIAGNOSIS — K8010 Calculus of gallbladder with chronic cholecystitis without obstruction: Secondary | ICD-10-CM

## 2014-06-08 DIAGNOSIS — Z8719 Personal history of other diseases of the digestive system: Secondary | ICD-10-CM

## 2014-06-08 LAB — COMPREHENSIVE METABOLIC PANEL
ALBUMIN: 3.4 g/dL — AB (ref 3.5–5.2)
ALT: 451 U/L — AB (ref 0–53)
ANION GAP: 13 (ref 5–15)
AST: 151 U/L — AB (ref 0–37)
Alkaline Phosphatase: 148 U/L — ABNORMAL HIGH (ref 39–117)
BUN: 9 mg/dL (ref 6–23)
CHLORIDE: 105 meq/L (ref 96–112)
CO2: 24 mEq/L (ref 19–32)
Calcium: 8.1 mg/dL — ABNORMAL LOW (ref 8.4–10.5)
Creatinine, Ser: 0.86 mg/dL (ref 0.50–1.35)
GFR calc Af Amer: >90 mL/min (ref >90)
GFR calc non Af Amer: >90 mL/min (ref >90)
Glucose, Bld: 83 mg/dL (ref 70–99)
Potassium: 4 mEq/L (ref 3.7–5.3)
SODIUM: 142 meq/L (ref 137–147)
TOTAL PROTEIN: 6.1 g/dL (ref 6.0–8.3)
Total Bilirubin: 1 mg/dL (ref 0.3–1.2)

## 2014-06-08 LAB — CBC
HCT: 40.8 % (ref 39.0–52.0)
HEMOGLOBIN: 14.1 g/dL (ref 13.0–17.0)
MCH: 30.5 pg (ref 26.0–34.0)
MCHC: 34.6 g/dL (ref 30.0–36.0)
MCV: 88.1 fL (ref 78.0–100.0)
Platelets: 220 10*3/uL (ref 150–400)
RBC: 4.63 MIL/uL (ref 4.22–5.81)
RDW: 12.5 % (ref 11.5–15.5)
WBC: 8.8 10*3/uL (ref 4.0–10.5)

## 2014-06-08 LAB — LIPASE, BLOOD: LIPASE: 1676 U/L — AB (ref 11–59)

## 2014-06-08 MED ORDER — PANTOPRAZOLE SODIUM 40 MG IV SOLR
40.0000 mg | Freq: Two times a day (BID) | INTRAVENOUS | Status: DC
  Administered 2014-06-08 – 2014-06-09 (×3): 40 mg via INTRAVENOUS
  Filled 2014-06-08 (×6): qty 40

## 2014-06-08 MED ORDER — PANTOPRAZOLE SODIUM 40 MG PO TBEC: 40.0000 mg | DELAYED_RELEASE_TABLET | Freq: Every day | ORAL | Status: DC

## 2014-06-08 MED ORDER — CEFAZOLIN SODIUM-DEXTROSE 2-3 GM-% IV SOLR
2.0000 g | INTRAVENOUS | Status: AC
  Administered 2014-06-09: 2 g via INTRAVENOUS
  Filled 2014-06-08: qty 50

## 2014-06-08 NOTE — Progress Notes (Signed)
Patient ID: Marcus [name]  male  BSW:[id number]    DOB: [birth year]    DOA: 06/07/2014  PCP: Marcus Bush, MD  Assessment/Plan: Principal Problem:   Pancreatitis, gallstone - Per patient fourth episode, has a known history of gallstones, patient has been seen in by surgery in the past and recommended cholecystectomy - Lipase down to 1676, continue n.p.o., IV fluids, pain control - General surgery consulted for laparoscopic cholecystectomy  Active Problems: Elevated LFTs: Likely due to cholestasis from gallstones, improving today    History of duodenal ulcer - Placed on IV PPI    Asthma - Currently stable  DVT Prophylaxis:  Code Status:  Family Communication: Discussed with patient in detail  Disposition:  Consultants:  General surgery  Procedures:  None  Antibiotics:  None    Subjective: Patient seen and examined, still having pain but significantly improved from admission, no nausea vomiting  Objective: Weight change:   Intake/Output Summary (Last 24 hours) at 06/08/14 0938 Last data filed at 06/07/14 2107  Gross per 24 hour  Intake      3 ml  Output      0 ml  Net      3 ml   Blood pressure 104/63, pulse 74, temperature 98.5 F (36.9 C), temperature source Oral, resp. rate 16, height 5\' 6"  (1.676 m), weight 73.891 kg (162 lb 14.4 oz), SpO2 100.00%.  Physical Exam: General: Alert and awake, oriented x3, not in any acute distress. CVS: S1-S2 clear, no murmur rubs or gallops Chest: clear to auscultation bilaterally, no wheezing, rales or rhonchi Abdomen: soft minimal tenderness upper abdomen , nondistended, normal bowel sounds  Extremities: no cyanosis, clubbing or edema noted bilaterally Neuro: Cranial nerves II-XII intact, no focal neurological deficits  Lab Results: Basic Metabolic Panel:  Recent Labs Lab 06/07/14 1624 06/08/14 0521  NA 135* 142  K 3.7 4.0  CL 96 105  CO2 22 24  GLUCOSE 187* 83  BUN 11 9  CREATININE 0.98 0.86    CALCIUM 9.6 8.1*   Liver Function Tests:  Recent Labs Lab 06/07/14 1624 06/08/14 0521  AST 390* 151*  ALT 640* 451*  ALKPHOS 180* 148*  BILITOT 3.0* 1.0  PROT 7.5 6.1  ALBUMIN 4.3 3.4*    Recent Labs Lab 06/07/14 1624 06/08/14 0521  LIPASE >3000* 1676*   No results found for this basename: AMMONIA,  in the last 168 hours CBC:  Recent Labs Lab 06/07/14 1624 06/08/14 0521  WBC 11.9* 8.8  NEUTROABS 10.3*  --   HGB 16.5 14.1  HCT 45.4 40.8  MCV 85.3 88.1  PLT 252 220   Cardiac Enzymes: No results found for this basename: CKTOTAL, CKMB, CKMBINDEX, TROPONINI,  in the last 168 hours BNP: No components found with this basename: POCBNP,  CBG: No results found for this basename: GLUCAP,  in the last 168 hours   Micro Results: No results found for this or any previous visit (from the past 240 hour(s)).  Studies/Results: US Abdomen Complete  06/07/2014   CLINICAL DATA:  Abdomen pain, nausea and vomiting.  EXAM: ULTRASOUND ABDOMEN COMPLETE  COMPARISON:  None.  FINDINGS: Gallbladder:  Gallstones are noted the gallbladder. No wall thickening visualized. No sonographic Murphy sign noted.  Common bile duct:  Diameter: 4.5 mm  Liver:  No focal lesion identified. Within normal limits in parenchymal echogenicity.  IVC:  No abnormality visualized.  Pancreas:  Visualized portion unremarkable.  Spleen:  Size and appearance within normal limits.  Right Kidney:  Length:  12.1 cm. Echogenicity within normal limits. No mass or hydronephrosis visualized.  Left Kidney:  Length: 12.8 cm. Echogenicity within normal limits. No mass or hydronephrosis visualized.  Abdominal aorta:  No aneurysm visualized.  Other findings:  None.  IMPRESSION: Cholelithiasis without sonographic evidence of acute cholecystitis. No acute abnormality.   Electronically Signed   By: Abelardo Diesel M.D.   On: 06/07/2014 19:04    Medications: Scheduled Meds: . heparin  5,000 Units Subcutaneous 3 times per day  .  pantoprazole  40 mg Oral Q1200  . sodium chloride  3 mL Intravenous Q12H      LOS: 1 day   Elvenia Godden M.D. [facility] 06/08/2014, 9:38 AM Pager: [phone]  If 7PM-7AM, please contact night-coverage [email or web address] Password [email or web address]  **Disclaimer: This note was dictated with voice recognition software. Similar sounding words can inadvertently be transcribed and this note may contain transcription errors which may not have been corrected upon publication of note.**

## 2014-06-08 NOTE — Progress Notes (Signed)
Patient ID: Marcus [name], male   DOB: [birth year], 43 y.o.   MRN: [id number]  His abdominal exam is fairly benign.  I suspect that I will be able to proceed with a lap chole and IOC tomorrow  I discussed the procedure in detail.    We discussed the risks and benefits of a laparoscopic cholecystectomy and cholangiogram including, but not limited to bleeding, infection, injury to surrounding structures such as the intestine or liver, bile leak, retained gallstones, need to convert to an open procedure, prolonged diarrhea, blood clots such as  DVT, common bile duct injury, anesthesia risks, and possible need for additional procedures.  The likelihood of improvement in symptoms and return to the patient's normal status is good. We discussed the typical post-operative recovery course.

## 2014-06-08 NOTE — Consult Note (Signed)
Reason for Consult: Gallstone Pancreatitis  Referring Physician: Dr. Estill Cotta   HPI: Marcus [name] is a 43 year old healthy male who presented to [facility] with worsening abdominal pain.  The last episode began suddenly on Sunday.  Location is in the epigastric, RUQ, LUQ without any radiation.  Characterized as a "funny feeling" that was intermittent.  Dull in quality.  Waxed and waned in severity.  He took pain medication which helped ease the pain.  His symptoms began after eating much like his previous 3 episodes over the past 6 months.  Modifying factors as above.  Alleviated initially with percocet.  He stated on Tuesday, the pain increased in intensity and became constant.  Associated with nausea and vomiting.  He denies fever, chills or sweats.  Denies diarrhea, melena or hematochezia.  He uses EtOH on social basis.  Denies a previous history of pancreatitis.  Denies any significant family history.  His work up showed a normal white count, transaminitis, lipase of >1676, [place] with gallstones.  We have therefore been asked to evaluate the patient.  He has been seen by Dr. Johney Maine and had a planned cholecystectomy in 2 months.  Past Medical History  Diagnosis Date  . Wrist fracture, left 2011  . History of duodenal ulcer 2005    worse with stress  . Asthma     Past Surgical History  Procedure Laterality Date  . Wrist fracture surgery  2011    hardware (pins)  . Tonsillectomy  1980 (?)    Family History  Problem Relation Age of Onset  . Diabetes Maternal Grandmother   . CAD Maternal Grandmother   . Cancer Neg Hx   . Stroke Neg Hx     Social History:  reports that he has never smoked. He has never used smokeless tobacco. He reports that he drinks alcohol. He reports that he does not use illicit drugs.  Allergies: No Known Allergies  Medications:  Scheduled Meds: . heparin  5,000 Units Subcutaneous 3 times per day  . pantoprazole  40 mg Oral Q1200  . sodium chloride  3 mL Intravenous  Q12H   Continuous Infusions: . sodium chloride 125 mL/hr at 06/08/14 0413   PRN Meds:.sodium chloride, morphine injection, ondansetron, sodium chloride   Results for orders placed during the hospital encounter of 06/07/14 (from the past 48 hour(s))  CBC WITH DIFFERENTIAL     Status: Abnormal   Collection Time    06/07/14  4:24 PM      Result Value Ref Range   WBC 11.9 (*) 4.0 - 10.5 K/uL   RBC 5.32  4.22 - 5.81 MIL/uL   Hemoglobin 16.5  13.0 - 17.0 g/dL   HCT 45.4  39.0 - 52.0 %   MCV 85.3  78.0 - 100.0 fL   MCH 31.0  26.0 - 34.0 pg   MCHC 36.3 (*) 30.0 - 36.0 g/dL   RDW 12.0  11.5 - 15.5 %   Platelets 252  150 - 400 K/uL   Neutrophils Relative % 87 (*) 43 - 77 %   Neutro Abs 10.3 (*) 1.7 - 7.7 K/uL   Lymphocytes Relative 7 (*) 12 - 46 %   Lymphs Abs 0.9  0.7 - 4.0 K/uL   Monocytes Relative 6  3 - 12 %   Monocytes Absolute 0.7  0.1 - 1.0 K/uL   Eosinophils Relative 0  0 - 5 %   Eosinophils Absolute 0.0  0.0 - 0.7 K/uL   Basophils Relative 0  0 - 1 %   Basophils Absolute 0.0  0.0 - 0.1 K/uL  COMPREHENSIVE METABOLIC PANEL     Status: Abnormal   Collection Time    06/07/14  4:24 PM      Result Value Ref Range   Sodium 135 (*) 137 - 147 mEq/L   Potassium 3.7  3.7 - 5.3 mEq/L   Chloride 96  96 - 112 mEq/L   CO2 22  19 - 32 mEq/L   Glucose, Bld 187 (*) 70 - 99 mg/dL   BUN 11  6 - 23 mg/dL   Creatinine, Ser 0.98  0.50 - 1.35 mg/dL   Calcium 9.6  8.4 - 10.5 mg/dL   Total Protein 7.5  6.0 - 8.3 g/dL   Albumin 4.3  3.5 - 5.2 g/dL   AST 390 (*) 0 - 37 U/L   Comment: HEMOLYSIS AT THIS LEVEL MAY AFFECT RESULT   ALT 640 (*) 0 - 53 U/L   Alkaline Phosphatase 180 (*) 39 - 117 U/L   Total Bilirubin 3.0 (*) 0.3 - 1.2 mg/dL   GFR calc non Af Amer >90  >90 mL/min   GFR calc Af Amer >90  >90 mL/min   Comment: (NOTE)     The eGFR has been calculated using the CKD EPI equation.     This calculation has not been validated in all clinical situations.     eGFR's persistently <90 mL/min  signify possible Chronic Kidney     Disease.   Anion gap 17 (*) 5 - 15  LIPASE, BLOOD     Status: Abnormal   Collection Time    06/07/14  4:24 PM      Result Value Ref Range   Lipase >3000 (*) 11 - 59 U/L   Comment: REPEATED TO VERIFY  URINALYSIS, ROUTINE W REFLEX MICROSCOPIC     Status: Abnormal   Collection Time    06/07/14  8:10 PM      Result Value Ref Range   Color, Urine AMBER (*) YELLOW   Comment: BIOCHEMICALS MAY BE AFFECTED BY COLOR   APPearance CLEAR  CLEAR   Specific Gravity, Urine 1.020  1.005 - 1.030   pH 7.5  5.0 - 8.0   Glucose, UA NEGATIVE  NEGATIVE mg/dL   Hgb urine dipstick NEGATIVE  NEGATIVE   Bilirubin Urine SMALL (*) NEGATIVE   Ketones, ur 15 (*) NEGATIVE mg/dL   Protein, ur NEGATIVE  NEGATIVE mg/dL   Urobilinogen, UA 1.0  0.0 - 1.0 mg/dL   Nitrite NEGATIVE  NEGATIVE   Leukocytes, UA TRACE (*) NEGATIVE  URINE MICROSCOPIC-ADD ON     Status: None   Collection Time    06/07/14  8:10 PM      Result Value Ref Range   Squamous Epithelial / LPF RARE  RARE   WBC, UA 0-2  <3 WBC/hpf   Bacteria, UA RARE  RARE   Urine-Other MUCOUS PRESENT    PROTIME-INR     Status: None   Collection Time    06/07/14 10:15 PM      Result Value Ref Range   Prothrombin Time 13.9  11.6 - 15.2 seconds   INR 1.07  0.00 - 1.49  APTT     Status: None   Collection Time    06/07/14 10:15 PM      Result Value Ref Range   aPTT 27  24 - 37 seconds  COMPREHENSIVE METABOLIC PANEL     Status: Abnormal   Collection Time  06/08/14  5:21 AM      Result Value Ref Range   Sodium 142  137 - 147 mEq/L   Comment: DELTA CHECK NOTED   Potassium 4.0  3.7 - 5.3 mEq/L   Chloride 105  96 - 112 mEq/L   Comment: DELTA CHECK NOTED   CO2 24  19 - 32 mEq/L   Glucose, Bld 83  70 - 99 mg/dL   BUN 9  6 - 23 mg/dL   Creatinine, Ser 0.86  0.50 - 1.35 mg/dL   Calcium 8.1 (*) 8.4 - 10.5 mg/dL   Total Protein 6.1  6.0 - 8.3 g/dL   Albumin 3.4 (*) 3.5 - 5.2 g/dL   AST 151 (*) 0 - 37 U/L   ALT 451 (*) 0  - 53 U/L   Alkaline Phosphatase 148 (*) 39 - 117 U/L   Total Bilirubin 1.0  0.3 - 1.2 mg/dL   GFR calc non Af Amer >90  >90 mL/min   GFR calc Af Amer >90  >90 mL/min   Comment: (NOTE)     The eGFR has been calculated using the CKD EPI equation.     This calculation has not been validated in all clinical situations.     eGFR's persistently <90 mL/min signify possible Chronic Kidney     Disease.   Anion gap 13  5 - 15  CBC     Status: None   Collection Time    06/08/14  5:21 AM      Result Value Ref Range   WBC 8.8  4.0 - 10.5 K/uL   RBC 4.63  4.22 - 5.81 MIL/uL   Hemoglobin 14.1  13.0 - 17.0 g/dL   HCT 40.8  39.0 - 52.0 %   MCV 88.1  78.0 - 100.0 fL   MCH 30.5  26.0 - 34.0 pg   MCHC 34.6  30.0 - 36.0 g/dL   RDW 12.5  11.5 - 15.5 %   Platelets 220  150 - 400 K/uL    US Abdomen Complete  06/07/2014   CLINICAL DATA:  Abdomen pain, nausea and vomiting.  EXAM: ULTRASOUND ABDOMEN COMPLETE  COMPARISON:  None.  FINDINGS: Gallbladder:  Gallstones are noted the gallbladder. No wall thickening visualized. No sonographic Murphy sign noted.  Common bile duct:  Diameter: 4.5 mm  Liver:  No focal lesion identified. Within normal limits in parenchymal echogenicity.  IVC:  No abnormality visualized.  Pancreas:  Visualized portion unremarkable.  Spleen:  Size and appearance within normal limits.  Right Kidney:  Length: 12.1 cm. Echogenicity within normal limits. No mass or hydronephrosis visualized.  Left Kidney:  Length: 12.8 cm. Echogenicity within normal limits. No mass or hydronephrosis visualized.  Abdominal aorta:  No aneurysm visualized.  Other findings:  None.  IMPRESSION: Cholelithiasis without sonographic evidence of acute cholecystitis. No acute abnormality.   Electronically Signed   By: Abelardo Diesel M.D.   On: 06/07/2014 19:04    Review of Systems  All other systems reviewed and are negative.  Blood pressure 104/63, pulse 74, temperature 98.5 F (36.9 C), temperature source Oral, resp.  rate 16, height 5' 6"  (1.676 m), weight 162 lb 14.4 oz (73.891 kg), SpO2 100.00%. Physical Exam  Constitutional: He is oriented to person, place, and time. He appears well-developed and well-nourished. No distress.  Neck: Normal range of motion. Neck supple.  Cardiovascular: Normal rate, regular rhythm, normal heart sounds and intact distal pulses.  Exam reveals no gallop.   No murmur heard.  Respiratory: Effort normal and breath sounds normal. No respiratory distress. He has no wheezes. He has no rales. He exhibits no tenderness.  GI: Soft. Bowel sounds are normal. He exhibits no distension and no mass. There is no rebound and no guarding.  Mild tenderness to upper abdomen, negative murphy's sign.  No guarding.  Musculoskeletal: Normal range of motion. He exhibits no edema and no tenderness.  Lymphadenopathy:    He has no cervical adenopathy.  Neurological: He is alert and oriented to person, place, and time.  Skin: Skin is warm and dry. No rash noted. He is not diaphoretic. No erythema. No pallor.  Psychiatric: He has a normal mood and affect. His behavior is normal. Judgment and thought content normal.    Assessment/Plan: Gallstone pancreatitis We will plan to proceed with a cholecystectomy once pancreatitis has resolved.  Despite being mildly tender, lipase  Is 1676.  We will repeat his labs tomorrow.  Continue with IV hydration and bowel rest.  Thank you for the consult.   Meosha Castanon ANP-BC 06/08/2014, 8:37 AM

## 2014-06-08 NOTE — Consult Note (Signed)
I have seen and examined the patient and agree with the assessment and plans. Lap chole with c-gram this admission when pancreatitis improved  Jhamari Markowicz A. Ninfa Linden  MD, FACS

## 2014-06-09 ENCOUNTER — Encounter (HOSPITAL_COMMUNITY)
Admission: EM | Disposition: A | Discharge status: Home / Self Care | Payer: Self-pay | Source: Home / Self Care | Attending: Internal Medicine

## 2014-06-09 ENCOUNTER — Encounter (HOSPITAL_COMMUNITY): Payer: 59 | Admitting: Critical Care Medicine

## 2014-06-09 ENCOUNTER — Inpatient Hospital Stay (HOSPITAL_COMMUNITY): Payer: 59

## 2014-06-09 ENCOUNTER — Encounter (HOSPITAL_COMMUNITY): Payer: Self-pay | Admitting: Critical Care Medicine

## 2014-06-09 ENCOUNTER — Inpatient Hospital Stay (HOSPITAL_COMMUNITY): Payer: 59 | Admitting: Critical Care Medicine

## 2014-06-09 DIAGNOSIS — R7402 Elevation of levels of lactic acid dehydrogenase (LDH): Secondary | ICD-10-CM

## 2014-06-09 DIAGNOSIS — K859 Acute pancreatitis without necrosis or infection, unspecified: Secondary | ICD-10-CM

## 2014-06-09 DIAGNOSIS — K8020 Calculus of gallbladder without cholecystitis without obstruction: Secondary | ICD-10-CM

## 2014-06-09 DIAGNOSIS — R932 Abnormal findings on diagnostic imaging of liver and biliary tract: Secondary | ICD-10-CM

## 2014-06-09 DIAGNOSIS — R740 Nonspecific elevation of levels of transaminase and lactic acid dehydrogenase [LDH]: Secondary | ICD-10-CM

## 2014-06-09 DIAGNOSIS — K8050 Calculus of bile duct without cholangitis or cholecystitis without obstruction: Secondary | ICD-10-CM

## 2014-06-09 HISTORY — PX: CHOLECYSTECTOMY: SHX55

## 2014-06-09 LAB — COMPREHENSIVE METABOLIC PANEL
ALK PHOS: 120 U/L — AB (ref 39–117)
ALT: 264 U/L — AB (ref 0–53)
AST: 50 U/L — AB (ref 0–37)
Albumin: 3 g/dL — ABNORMAL LOW (ref 3.5–5.2)
Anion gap: 14 (ref 5–15)
BUN: 13 mg/dL (ref 6–23)
CALCIUM: 7.8 mg/dL — AB (ref 8.4–10.5)
CO2: 18 mEq/L — ABNORMAL LOW (ref 19–32)
Chloride: 106 mEq/L (ref 96–112)
Creatinine, Ser: 0.77 mg/dL (ref 0.50–1.35)
GFR calc Af Amer: >90 mL/min (ref >90)
GFR calc non Af Amer: >90 mL/min (ref >90)
Glucose, Bld: 62 mg/dL — ABNORMAL LOW (ref 70–99)
POTASSIUM: 4.3 meq/L (ref 3.7–5.3)
Sodium: 138 mEq/L (ref 137–147)
TOTAL PROTEIN: 5.8 g/dL — AB (ref 6.0–8.3)
Total Bilirubin: 0.8 mg/dL (ref 0.3–1.2)

## 2014-06-09 LAB — LIPASE, BLOOD: Lipase: 453 U/L — ABNORMAL HIGH (ref 11–59)

## 2014-06-09 LAB — SURGICAL PCR SCREEN
MRSA, PCR: NEGATIVE
STAPHYLOCOCCUS AUREUS: NEGATIVE

## 2014-06-09 LAB — GLUCOSE, CAPILLARY
GLUCOSE-CAPILLARY: 91 mg/dL (ref 70–99)
GLUCOSE-CAPILLARY: 95 mg/dL (ref 70–99)
GLUCOSE-CAPILLARY: 125 mg/dL — AB (ref 70–99)
Glucose-Capillary: 116 mg/dL — ABNORMAL HIGH (ref 70–99)

## 2014-06-09 SURGERY — LAPAROSCOPIC CHOLECYSTECTOMY WITH INTRAOPERATIVE CHOLANGIOGRAM
Anesthesia: General | Site: Abdomen

## 2014-06-09 MED ORDER — 0.9 % SODIUM CHLORIDE (POUR BTL) OPTIME
TOPICAL | Status: DC | PRN
  Administered 2014-06-09: 1000 mL

## 2014-06-09 MED ORDER — MIDAZOLAM HCL 2 MG/2ML IJ SOLN: 0.5000 mg | Freq: Once | INTRAMUSCULAR | Status: DC | PRN

## 2014-06-09 MED ORDER — DEXTROSE 50 % IV SOLN
25.0000 mL | Freq: Once | INTRAVENOUS | Status: AC | PRN
  Administered 2014-06-09: 25 mL via INTRAVENOUS

## 2014-06-09 MED ORDER — LIDOCAINE HCL (CARDIAC) 20 MG/ML IV SOLN
INTRAVENOUS | Status: DC | PRN
  Administered 2014-06-09: 20 mg via INTRAVENOUS
  Administered 2014-06-09: 100 mg via INTRATRACHEAL

## 2014-06-09 MED ORDER — LIDOCAINE HCL (CARDIAC) 20 MG/ML IV SOLN
INTRAVENOUS | Status: AC
  Filled 2014-06-09: qty 10

## 2014-06-09 MED ORDER — MIDAZOLAM HCL 5 MG/5ML IJ SOLN
INTRAMUSCULAR | Status: DC | PRN
  Administered 2014-06-09 (×2): 2 mg via INTRAVENOUS

## 2014-06-09 MED ORDER — OXYCODONE HCL 5 MG/5ML PO SOLN: 5.0000 mg | Freq: Once | ORAL | Status: DC | PRN

## 2014-06-09 MED ORDER — FENTANYL CITRATE 0.05 MG/ML IJ SOLN
INTRAMUSCULAR | Status: DC | PRN
  Administered 2014-06-09: 50 ug via INTRAVENOUS
  Administered 2014-06-09: 100 ug via INTRAVENOUS
  Administered 2014-06-09 (×3): 50 ug via INTRAVENOUS

## 2014-06-09 MED ORDER — OXYCODONE-ACETAMINOPHEN 5-325 MG PO TABS: 1.0000 | ORAL_TABLET | ORAL | Status: DC | PRN

## 2014-06-09 MED ORDER — SUCCINYLCHOLINE CHLORIDE 20 MG/ML IJ SOLN
INTRAMUSCULAR | Status: DC | PRN
  Administered 2014-06-09: 140 mg via INTRAVENOUS

## 2014-06-09 MED ORDER — GLYCOPYRROLATE 0.2 MG/ML IJ SOLN
INTRAMUSCULAR | Status: AC
  Filled 2014-06-09: qty 2

## 2014-06-09 MED ORDER — SODIUM CHLORIDE 0.9 % IR SOLN
Status: DC | PRN
Start: 2014-06-09 — End: 2014-06-09
  Administered 2014-06-09: 1000 mL

## 2014-06-09 MED ORDER — PROPOFOL 10 MG/ML IV BOLUS
INTRAVENOUS | Status: AC
  Filled 2014-06-09: qty 20

## 2014-06-09 MED ORDER — OXYCODONE HCL 5 MG PO TABS: 5.0000 mg | ORAL_TABLET | Freq: Once | ORAL | Status: DC | PRN

## 2014-06-09 MED ORDER — DIPHENHYDRAMINE HCL 50 MG/ML IJ SOLN: 10.0000 mg | Freq: Once | INTRAMUSCULAR | Status: DC

## 2014-06-09 MED ORDER — ROCURONIUM BROMIDE 100 MG/10ML IV SOLN
INTRAVENOUS | Status: DC | PRN
  Administered 2014-06-09: 10 mg via INTRAVENOUS

## 2014-06-09 MED ORDER — MEPERIDINE HCL 25 MG/ML IJ SOLN: 6.2500 mg | INTRAMUSCULAR | Status: DC | PRN

## 2014-06-09 MED ORDER — ARTIFICIAL TEARS OP OINT
TOPICAL_OINTMENT | OPHTHALMIC | Status: DC | PRN
  Administered 2014-06-09: 1 via OPHTHALMIC

## 2014-06-09 MED ORDER — MORPHINE SULFATE 2 MG/ML IJ SOLN
1.0000 mg | INTRAMUSCULAR | Status: DC | PRN
  Administered 2014-06-09 – 2014-06-10 (×2): 2 mg via INTRAVENOUS
  Filled 2014-06-09 (×2): qty 1

## 2014-06-09 MED ORDER — BUPIVACAINE-EPINEPHRINE (PF) 0.25% -1:200000 IJ SOLN
INTRAMUSCULAR | Status: AC
  Filled 2014-06-09: qty 30

## 2014-06-09 MED ORDER — PROPOFOL 10 MG/ML IV BOLUS
INTRAVENOUS | Status: DC | PRN
  Administered 2014-06-09 (×2): 20 mg via INTRAVENOUS
  Administered 2014-06-09: 200 mg via INTRAVENOUS
  Administered 2014-06-09: 60 mg via INTRAVENOUS
  Administered 2014-06-09: 100 mg via INTRAVENOUS

## 2014-06-09 MED ORDER — SCOPOLAMINE 1 MG/3DAYS TD PT72
MEDICATED_PATCH | TRANSDERMAL | Status: DC | PRN
  Administered 2014-06-09: 1 via TRANSDERMAL

## 2014-06-09 MED ORDER — DEXTROSE-NACL 5-0.45 % IV SOLN
INTRAVENOUS | Status: DC
  Administered 2014-06-09 – 2014-06-10 (×4): via INTRAVENOUS

## 2014-06-09 MED ORDER — DEXAMETHASONE SODIUM PHOSPHATE 4 MG/ML IJ SOLN
INTRAMUSCULAR | Status: DC | PRN
  Administered 2014-06-09: 4 mg via INTRAVENOUS

## 2014-06-09 MED ORDER — IOHEXOL 300 MG/ML  SOLN
INTRAMUSCULAR | Status: DC | PRN
  Administered 2014-06-09: 11:00:00

## 2014-06-09 MED ORDER — SODIUM CHLORIDE 0.9 % IV SOLN
3.0000 g | Freq: Once | INTRAVENOUS | Status: AC
  Administered 2014-06-10: 3 g via INTRAVENOUS
  Filled 2014-06-09 (×2): qty 3

## 2014-06-09 MED ORDER — GLYCOPYRROLATE 0.2 MG/ML IJ SOLN
INTRAMUSCULAR | Status: DC | PRN
  Administered 2014-06-09: 0.4 mg via INTRAVENOUS

## 2014-06-09 MED ORDER — PROMETHAZINE HCL 25 MG/ML IJ SOLN: 6.2500 mg | INTRAMUSCULAR | Status: DC | PRN

## 2014-06-09 MED ORDER — MIDAZOLAM HCL 2 MG/2ML IJ SOLN
INTRAMUSCULAR | Status: AC
  Filled 2014-06-09: qty 2

## 2014-06-09 MED ORDER — HYDROMORPHONE HCL PF 1 MG/ML IJ SOLN
INTRAMUSCULAR | Status: AC
  Filled 2014-06-09: qty 1

## 2014-06-09 MED ORDER — SUCCINYLCHOLINE CHLORIDE 20 MG/ML IJ SOLN
INTRAMUSCULAR | Status: AC
  Filled 2014-06-09: qty 1

## 2014-06-09 MED ORDER — BUPIVACAINE-EPINEPHRINE 0.25% -1:200000 IJ SOLN
INTRAMUSCULAR | Status: DC | PRN
  Administered 2014-06-09: 20 mL

## 2014-06-09 MED ORDER — HYDROMORPHONE HCL PF 1 MG/ML IJ SOLN
0.2500 mg | INTRAMUSCULAR | Status: DC | PRN
  Administered 2014-06-09 (×2): 0.5 mg via INTRAVENOUS

## 2014-06-09 MED ORDER — SCOPOLAMINE 1 MG/3DAYS TD PT72: 1.0000 | MEDICATED_PATCH | Freq: Once | TRANSDERMAL | Status: DC

## 2014-06-09 MED ORDER — LACTATED RINGERS IV SOLN
INTRAVENOUS | Status: DC
  Administered 2014-06-09 – 2014-06-10 (×3): via INTRAVENOUS

## 2014-06-09 MED ORDER — DEXAMETHASONE SODIUM PHOSPHATE 4 MG/ML IJ SOLN
INTRAMUSCULAR | Status: AC
  Filled 2014-06-09: qty 1

## 2014-06-09 MED ORDER — FENTANYL CITRATE 0.05 MG/ML IJ SOLN
INTRAMUSCULAR | Status: AC
  Filled 2014-06-09: qty 5

## 2014-06-09 MED ORDER — NEOSTIGMINE METHYLSULFATE 10 MG/10ML IV SOLN
INTRAVENOUS | Status: DC | PRN
  Administered 2014-06-09: 3 mg via INTRAVENOUS

## 2014-06-09 MED ORDER — DIPHENHYDRAMINE HCL 50 MG/ML IJ SOLN
INTRAMUSCULAR | Status: DC | PRN
  Administered 2014-06-09: 10 mg via INTRAVENOUS

## 2014-06-09 MED ORDER — NEOSTIGMINE METHYLSULFATE 10 MG/10ML IV SOLN
INTRAVENOUS | Status: AC
  Filled 2014-06-09: qty 1

## 2014-06-09 MED ORDER — ONDANSETRON HCL 4 MG/2ML IJ SOLN
INTRAMUSCULAR | Status: DC | PRN
  Administered 2014-06-09: 4 mg via INTRAVENOUS

## 2014-06-09 MED ORDER — DEXTROSE 50 % IV SOLN
INTRAVENOUS | Status: AC
  Filled 2014-06-09: qty 50

## 2014-06-09 MED ORDER — INDOMETHACIN 50 MG RE SUPP
100.0000 mg | Freq: Once | RECTAL | Status: AC
  Administered 2014-06-10: 100 mg via RECTAL
  Filled 2014-06-09 (×2): qty 2

## 2014-06-09 MED ORDER — SCOPOLAMINE 1 MG/3DAYS TD PT72
MEDICATED_PATCH | TRANSDERMAL | Status: AC
  Filled 2014-06-09: qty 1

## 2014-06-09 SURGICAL SUPPLY — 35 items
APPLIER CLIP 5 13 M/L LIGAMAX5 (MISCELLANEOUS) ×3
BANDAGE ADH SHEER 1  50/CT (GAUZE/BANDAGES/DRESSINGS) ×3 IMPLANT
BENZOIN TINCTURE PRP APPL 2/3 (GAUZE/BANDAGES/DRESSINGS) IMPLANT
CANISTER SUCTION 2500CC (MISCELLANEOUS) ×3 IMPLANT
CHLORAPREP W/TINT 26ML (MISCELLANEOUS) ×3 IMPLANT
CLIP APPLIE 5 13 M/L LIGAMAX5 (MISCELLANEOUS) ×1 IMPLANT
CLOSURE STERI-STRIP 1/2X4 (GAUZE/BANDAGES/DRESSINGS) ×1
CLSR STERI-STRIP ANTIMIC 1/2X4 (GAUZE/BANDAGES/DRESSINGS) ×2 IMPLANT
COVER MAYO STAND STRL (DRAPES) ×3 IMPLANT
COVER SURGICAL LIGHT HANDLE (MISCELLANEOUS) ×3 IMPLANT
DRAPE C-ARM 42X72 X-RAY (DRAPES) ×3 IMPLANT
DRAPE UTILITY 15X26 W/TAPE STR (DRAPE) ×6 IMPLANT
ELECT REM PT RETURN 9FT ADLT (ELECTROSURGICAL) ×3
ELECTRODE REM PT RTRN 9FT ADLT (ELECTROSURGICAL) ×1 IMPLANT
GLOVE SURG SIGNA 7.5 PF LTX (GLOVE) ×3 IMPLANT
GOWN STRL REUS W/ TWL LRG LVL3 (GOWN DISPOSABLE) ×2 IMPLANT
GOWN STRL REUS W/ TWL XL LVL3 (GOWN DISPOSABLE) ×1 IMPLANT
GOWN STRL REUS W/TWL LRG LVL3 (GOWN DISPOSABLE) ×4
GOWN STRL REUS W/TWL XL LVL3 (GOWN DISPOSABLE) ×2
KIT BASIN OR (CUSTOM PROCEDURE TRAY) ×3 IMPLANT
KIT ROOM TURNOVER OR (KITS) ×3 IMPLANT
NS IRRIG 1000ML POUR BTL (IV SOLUTION) ×3 IMPLANT
PAD ARMBOARD 7.5X6 YLW CONV (MISCELLANEOUS) ×3 IMPLANT
POUCH SPECIMEN RETRIEVAL 10MM (ENDOMECHANICALS) ×3 IMPLANT
SCISSORS LAP 5X35 DISP (ENDOMECHANICALS) ×3 IMPLANT
SET CHOLANGIOGRAPH 5 50 .035 (SET/KITS/TRAYS/PACK) ×3 IMPLANT
SET IRRIG TUBING LAPAROSCOPIC (IRRIGATION / IRRIGATOR) ×3 IMPLANT
SLEEVE ENDOPATH XCEL 5M (ENDOMECHANICALS) ×6 IMPLANT
SPECIMEN JAR SMALL (MISCELLANEOUS) ×3 IMPLANT
SUT MON AB 4-0 PC3 18 (SUTURE) ×3 IMPLANT
TOWEL OR 17X24 6PK STRL BLUE (TOWEL DISPOSABLE) ×3 IMPLANT
TOWEL OR 17X26 10 PK STRL BLUE (TOWEL DISPOSABLE) ×3 IMPLANT
TRAY LAPAROSCOPIC (CUSTOM PROCEDURE TRAY) ×3 IMPLANT
TROCAR XCEL BLUNT TIP 100MML (ENDOMECHANICALS) ×3 IMPLANT
TROCAR XCEL NON-BLD 5MMX100MML (ENDOMECHANICALS) ×3 IMPLANT

## 2014-06-09 NOTE — Anesthesia Postprocedure Evaluation (Signed)
  Anesthesia Post-op Note  Patient: Marcus [name]  Procedure(s) Performed: Procedure(s): LAPAROSCOPIC CHOLECYSTECTOMY WITH INTRAOPERATIVE CHOLANGIOGRAM (N/A)  Patient Location: PACU  Anesthesia Type:General  Level of Consciousness: awake, alert , oriented and patient cooperative  Airway and Oxygen Therapy: Patient Spontanous Breathing  Post-op Pain: mild  Post-op Assessment: Post-op Vital signs reviewed, Patient's Cardiovascular Status Stable, Respiratory Function Stable, Patent Airway, No signs of Nausea or vomiting and Pain level controlled  Post-op Vital Signs: Reviewed and stable  Last Vitals:  Filed Vitals:   06/09/14 1147  BP: 121/76  Pulse: 62  Temp: 37 C  Resp: 22    Complications: No apparent anesthesia complications

## 2014-06-09 NOTE — Transfer of Care (Signed)
Immediate Anesthesia Transfer of Care Note  Patient: Marcus [name]  Procedure(s) Performed: Procedure(s): LAPAROSCOPIC CHOLECYSTECTOMY WITH INTRAOPERATIVE CHOLANGIOGRAM (N/A)  Patient Location: PACU  Anesthesia Type:General  Level of Consciousness: awake, alert  and oriented  Airway & Oxygen Therapy: Patient Spontanous Breathing and Patient connected to nasal cannula oxygen  Post-op Assessment: Report given to PACU RN, Post -op Vital signs reviewed and stable and Patient moving all extremities X 4  Post vital signs: Reviewed and stable  Complications: No apparent anesthesia complications

## 2014-06-09 NOTE — Progress Notes (Signed)
Glucose level this Am is 62.Pt asymptomatic. Tylene Fantasia made aware. D50 23ml given Iv. Will continue to monitor pt.

## 2014-06-09 NOTE — Op Note (Signed)
Laparoscopic Cholecystectomy with IOC Procedure Note  Indications: This patient presents with symptomatic gallbladder disease and will undergo laparoscopic cholecystectomy.  Pre-operative Diagnosis: Calculus of gallbladder without mention of cholecystitis, with obstruction  Post-operative Diagnosis: Calculus of gallbladder without mention of cholecystitis, with obstruction  Surgeon: [name] A   Assistants: 0  Anesthesia: General endotracheal anesthesia  ASA Class: 1  Procedure Details  The patient was [name] again in the Holding Room. The risks, benefits, complications, treatment options, and expected outcomes were discussed with the patient. The possibilities of reaction to medication, pulmonary aspiration, perforation of viscus, bleeding, recurrent infection, finding a normal gallbladder, the need for additional procedures, failure to diagnose a condition, the possible need to convert to an open procedure, and creating a complication requiring transfusion or operation were discussed with the patient. The likelihood of improving the patient's symptoms with return to their baseline status is good.  The patient and/or family concurred with the proposed plan, giving informed consent. The site of surgery properly noted. The patient was taken to Operating Room, identified as Marcus [name] and the procedure verified as Laparoscopic Cholecystectomy with Intraoperative Cholangiogram. A Time Out was held and the above information confirmed.  Prior to the induction of general anesthesia, antibiotic prophylaxis was administered. General endotracheal anesthesia was then administered and tolerated well. After the induction, the abdomen was prepped with Chloraprep and draped in the sterile fashion. The patient was positioned in the supine position.  Local anesthetic agent was injected into the skin near the umbilicus and an incision made. We dissected down to the abdominal fascia with blunt dissection.   The fascia was incised vertically and we entered the peritoneal cavity bluntly.  A pursestring suture of 0-Vicryl was placed around the fascial opening.  The Hasson cannula was inserted and secured with the stay suture.  Pneumoperitoneum was then created with CO2 and tolerated well without any adverse changes in the patient's vital signs. An 11-mm port was placed in the subxiphoid position.  Two 5-mm ports were placed in the right upper quadrant. All skin incisions were infiltrated with a local anesthetic agent before making the incision and placing the trocars.   We positioned the patient in reverse Trendelenburg, tilted slightly to the patient's left.  The gallbladder was identified, the fundus grasped and retracted cephalad. Adhesions were lysed bluntly and with the electrocautery where indicated, taking care not to injure any adjacent organs or viscus. The infundibulum was grasped and retracted laterally, exposing the peritoneum overlying the triangle of Calot. This was then divided and exposed in a blunt fashion. A critical view of the cystic duct and cystic artery was obtained.  The cystic duct was clearly identified and bluntly dissected circumferentially. The cystic duct was ligated with a clip distally.   An incision was made in the cystic duct and the [facility] cholangiogram catheter introduced. The catheter was secured using a clip. A cholangiogram was then obtained which showed good visualization of the distal and proximal biliary tree.  Contrast would not flow into the duodenum despite multiple attempts.  There appeared to be a distal CBD stone present. The catheter was then removed.   The cystic duct was then ligated with clips and divided. The cystic artery was identified, dissected free, ligated with clips and divided as well.   The gallbladder was dissected from the liver bed in retrograde fashion with the electrocautery. The gallbladder was removed and placed in an Endocatch sac. The liver bed was  irrigated and inspected. Hemostasis was achieved with  the electrocautery. Copious irrigation was utilized and was repeatedly aspirated until clear.  The gallbladder and Endocatch sac were then removed through the umbilical port site.  The pursestring suture was used to close the umbilical fascia.    We again inspected the right upper quadrant for hemostasis.  Pneumoperitoneum was released as we removed the trocars.  4-0 Monocryl was used to close the skin.   Benzoin, steri-strips, and clean dressings were applied. The patient was then extubated and brought to the recovery room in stable condition. Instrument, sponge, and needle counts were correct at closure and at the conclusion of the case.   Findings: Chronic Cholecystitis with Cholelithiasis,  Possible distal CBD stone  Estimated Blood Loss: Minimal         Drains: 0         Specimens: Gallbladder           Complications: None; patient tolerated the procedure well.         Disposition: PACU - hemodynamically stable.         Condition: stable

## 2014-06-09 NOTE — Anesthesia Preprocedure Evaluation (Signed)
Anesthesia Evaluation  Patient identified by MRN, date of birth, ID band Patient awake    Reviewed: Allergy & Precautions, H&P , NPO status , Patient's Chart, lab work & pertinent test results  History of Anesthesia Complications Negative for: history of anesthetic complications  Airway Mallampati: I TM Distance: >3 FB Neck ROM: Full    Dental  (+) Dental Advisory Given   Pulmonary neg pulmonary ROS,  breath sounds clear to auscultation  Pulmonary exam normal       Cardiovascular negative cardio ROS  Rhythm:Regular Rate:Normal     Neuro/Psych negative neurological ROS     GI/Hepatic Neg liver ROS, GERD-  Controlled and Medicated,Choledochocystitis: elevated LFTs   Endo/Other  negative endocrine ROS  Renal/GU negative Renal ROS     Musculoskeletal   Abdominal   Peds  Hematology   Anesthesia Other Findings   Reproductive/Obstetrics                           Anesthesia Physical Anesthesia Plan  ASA: I  Anesthesia Plan: General   Post-op Pain Management:    Induction: Intravenous  Airway Management Planned: Oral ETT  Additional Equipment:   Intra-op Plan:   Post-operative Plan: Extubation in OR  Informed Consent: I have reviewed the patients History and Physical, chart, labs and discussed the procedure including the risks, benefits and alternatives for the proposed anesthesia with the patient or authorized representative who has indicated his/her understanding and acceptance.   Dental advisory given  Plan Discussed with: CRNA and Surgeon  Anesthesia Plan Comments: (Plan routine monitors, GETA)        Anesthesia Quick Evaluation

## 2014-06-09 NOTE — Consult Note (Signed)
[facility] Consult: 11:58 AM 06/09/2014  LOS: 2 days    Referring Provider: Dr Ninfa Linden  Primary Care Physician:  Ria Bush, MD Primary Gastroenterologist:  unassigned    Reason for Consultation:  Positive IOC   HPI: Marcus [name] is a 43 y.o. male.  Had DU in 2005.  On no PPI PTA.  Admitted yesterday with epigastric, RUQ, LUQ abdominal pain. Had similar episodes x 3 in last 6 months. Abnormal LFTs, Lipase > 1600.  Gallstones on ultrasound, CBD 4.5 mm, pancreas not well seen.   This morning had ERCP with IOC, latter showed "Contrast would not flow into the duodenum despite multiple attempts. There appeared to be a distal CBD stone present."   Pt currently pain free and hungry.     Past Medical History  Diagnosis Date  . Wrist fracture, left 2011  . History of duodenal ulcer 2005    worse with stress  . Asthma     Past Surgical History  Procedure Laterality Date  . Wrist fracture surgery  2011    hardware (pins)  . Tonsillectomy  1980 (?)    Prior to Admission medications   Medication Sig Start Date End Date Taking? Authorizing Provider  ondansetron (ZOFRAN-ODT) 4 MG disintegrating tablet Take 4 mg by mouth every 8 (eight) hours as needed for nausea.  01/14/14  Yes Historical Provider, MD  oxyCODONE-acetaminophen (PERCOCET/ROXICET) 5-325 MG per tablet Take 1 tablet by mouth every 4 (four) hours as needed for moderate pain.  01/14/14  Yes Historical Provider, MD    Scheduled Meds: . heparin  5,000 Units Subcutaneous 3 times per day  . HYDROmorphone      . pantoprazole (PROTONIX) IV  40 mg Intravenous Q12H  . sodium chloride  3 mL Intravenous Q12H   Infusions: . dextrose 5 % and 0.45% NaCl 100 mL/hr at 06/09/14 0716  . lactated ringers 50 mL/hr at 06/09/14 0920   PRN Meds: sodium  chloride, morphine injection, ondansetron, sodium chloride   Allergies as of 06/07/2014  . (No Known Allergies)    Family History  Problem Relation Age of Onset  . Diabetes Maternal Grandmother   . CAD Maternal Grandmother   . Cancer Neg Hx   . Stroke Neg Hx     History   Social History  . Marital Status: Married    Spouse Name: N/A    Number of Children: N/A  . Years of Education: N/A   Occupational History  . Not on file.   Social History Main Topics  . Smoking status: Never Smoker   . Smokeless tobacco: Never Used  . Alcohol Use: Yes     Comment: Occasional  . Drug Use: No  . Sexual Activity: Not on file   Other Topics Concern  . Not on file   Social History Narrative   Spanish speaking, from [place]   Lives with wife and 2 sons and [address]   From [place], [place].     Occupation: works with [employer]   Edu: some college Administrator)   Activity: 2x/wk -  trail running   Diet: good water, fruits/vegetables daily    REVIEW OF SYSTEMS: Constitutional:  No weakness.  ENT:  No nose bleeds Pulm:  No sob or cough CV:  No palpitations, no LE edema.  GU:  No hematuria, no frequency GI:  Per hpi.  No dysphagia.  Not sure if he was H pylori + in past Heme:  No anemia   Transfusions:  none Neuro:  No headaches, no peripheral tingling or numbness Derm:  No itching, no rash or sores.  Endocrine:  No sweats or chills.  No polyuria or dysuria Immunization:  Not queried.  Travel:  None beyond local counties in last few months.    PHYSICAL EXAM: Vital signs in last 24 hours: Filed Vitals:   06/09/14 1147  BP: 121/76  Pulse: 62  Temp: 98.6 F (37 C)  Resp: 22   Wt Readings from Last 3 Encounters:  06/09/14 75.342 kg (166 lb 1.6 oz)  06/09/14 75.342 kg (166 lb 1.6 oz)  04/29/14 73.596 kg (162 lb 4 oz)    General: pleasant, alert, looks well.  Excellent Englis Head:  No asyymetry or swelling  Eyes:  No icterus or pallor Ears:  Not HOH    Nose:  No congestion Mouth:  Clear and moist Neck:  No mass, no JVD Lungs:  Clear bil.  No cough or SOB Heart: RRR.  No MRG Abdomen:  Soft, BS absent, ND.  Some blood on incision bandages.  Mild tenderness in RUQ.   Rectal: not done   Musc/Skeltl: no joint swelling or deformity Extremities:  No CCE  Neurologic:  Pleasant, oriented x3.  Good historian.  No gross deficits no tremor.  No limbweakness Skin:  No jaundice or rash Tattoos:  none Nodes:  No cervical adenopathy   Psych:  Pleasant, relaxed, cooperative.   Intake/Output from previous day: 09/09 0701 - 09/10 0700 In: 1235 [I.V.:1235] Out: -  Intake/Output this shift: Total I/O In: 1000 [I.V.:1000] Out: 20 [Blood:20]  LAB RESULTS:  Recent Labs  06/07/14 1624 06/08/14 0521  WBC 11.9* 8.8  HGB 16.5 14.1  HCT 45.4 40.8  PLT 252 220   BMET Lab Results  Component Value Date   NA 138 06/09/2014   NA 142 06/08/2014   NA 135* 06/07/2014   K 4.3 06/09/2014   K 4.0 06/08/2014   K 3.7 06/07/2014   CL 106 06/09/2014   CL 105 06/08/2014   CL 96 06/07/2014   CO2 18* 06/09/2014   CO2 24 06/08/2014   CO2 22 06/07/2014   GLUCOSE 62* 06/09/2014   GLUCOSE 83 06/08/2014   GLUCOSE 187* 06/07/2014   BUN 13 06/09/2014   BUN 9 06/08/2014   BUN 11 06/07/2014   CREATININE 0.77 06/09/2014   CREATININE 0.86 06/08/2014   CREATININE 0.98 06/07/2014   CALCIUM 7.8* 06/09/2014   CALCIUM 8.1* 06/08/2014   CALCIUM 9.6 06/07/2014   LFT  Recent Labs  06/07/14 1624 06/08/14 0521 06/09/14 0447  PROT 7.5 6.1 5.8*  ALBUMIN 4.3 3.4* 3.0*  AST 390* 151* 50*  ALT 640* 451* 264*  ALKPHOS 180* 148* 120*  BILITOT 3.0* 1.0 0.8   PT/INR Lab Results  Component Value Date   INR 1.07 06/07/2014   Hepatitis Panel No results found for this basename: HEPBSAG, HCVAB, HEPAIGM, HEPBIGM,  in the last 72 hours C-Diff No components found with this basename: cdiff   Lipase     Component Value Date/Time   LIPASE 453* 06/09/2014 0447  Drugs of Abuse  No results found for  this basename: labopia, cocainscrnur, labbenz, amphetmu, thcu, labbarb     RADIOLOGY STUDIES: US Abdomen Complete  06/07/2014   CLINICAL DATA:  Abdomen pain, nausea and vomiting.  EXAM: ULTRASOUND ABDOMEN COMPLETE  COMPARISON:  None.  FINDINGS: Gallbladder:  Gallstones are noted the gallbladder. No wall thickening visualized. No sonographic Murphy sign noted.  Common bile duct:  Diameter: 4.5 mm  Liver:  No focal lesion identified. Within normal limits in parenchymal echogenicity.  IVC:  No abnormality visualized.  Pancreas:  Visualized portion unremarkable.  Spleen:  Size and appearance within normal limits.  Right Kidney:  Length: 12.1 cm. Echogenicity within normal limits. No mass or hydronephrosis visualized.  Left Kidney:  Length: 12.8 cm. Echogenicity within normal limits. No mass or hydronephrosis visualized.  Abdominal aorta:  No aneurysm visualized.  Other findings:  None.  IMPRESSION: Cholelithiasis without sonographic evidence of acute cholecystitis. No acute abnormality.   Electronically Signed   By: Abelardo Diesel M.D.   On: 06/07/2014 19:04    ENDOSCOPIC STUDIES: None  IMPRESSION:   *  Biliary pancreatitis.  *  S/p lap chole 9/10  *  Abnormal IOC, suspect stone in CBD.     PLAN:     *  Per Dr Henrene Pastor. Has slot for ERCP set for 0900 but wait for Dr Henrene Pastor to confirm this.  *  Discussed risks, benefits with pt and he is agreeable to proceed.   *  Indocin pr after ERCP.  *  Clears tonight.    Azucena Freed  06/09/2014, 11:58 AM Pager: [phone]  GI ATTENDING  History, laboratories, x-rays reviewed. Case discussed with Dr. Ninfa Linden. Agree with H&P as above. He does appear to have stone debris in the distal bile duct without overwhelming stone. We'll proceed with ERCP and possible sphincterotomy with stone extraction.The nature of the procedure, as well as the risks, benefits, and alternatives were carefully and thoroughly reviewed with the patient. Ample time for discussion and  questions allowed. The patient understood, was satisfied, and agreed to proceed.  Docia Chuck. Geri Seminole., M.D. [facility] Division of Gastroenterology

## 2014-06-09 NOTE — Anesthesia Procedure Notes (Signed)
Procedure Name: Intubation Date/Time: 06/09/2014 10:00 AM Performed by: Carola Frost Pre-anesthesia Checklist: Patient identified, Timeout performed, Emergency Drugs available, Suction available and Patient being monitored Patient Re-evaluated:Patient Re-evaluated prior to inductionOxygen Delivery Method: Circle system utilized Preoxygenation: Pre-oxygenation with 100% oxygen Intubation Type: IV induction and Cricoid Pressure applied Ventilation: Mask ventilation without difficulty Laryngoscope Size: Mac and 4 Grade View: Grade III Tube type: Oral Tube size: 7.5 mm Number of attempts: 1 Airway Equipment and Method: Stylet and LTA kit utilized Placement Confirmation: CO2 detector,  positive ETCO2,  ETT inserted through vocal cords under direct vision and breath sounds checked- equal and bilateral Secured at: 23 cm Tube secured with: Tape Dental Injury: Teeth and Oropharynx as per pre-operative assessment

## 2014-06-09 NOTE — Progress Notes (Signed)
Patient ID: Marcus [name]  male  GYB:[id number]    DOB: October 20, 1970    DOA: 06/07/2014  PCP: Ria Bush, MD  Assessment/Plan: Principal Problem:  Acute Pancreatitis, gallstone - Lipase down to 453, continue n.p.o., IV fluids, pain control - General surgery consulted, undergoing laparoscopic cholecystectomy today  Active Problems: Elevated LFTs: Likely due to cholestasis from gallstones, improving today    History of duodenal ulcer - on IV PPI    Asthma - Currently stable  DVT Prophylaxis:  Code Status:  Family Communication: Discussed with patient and wife in detail  Disposition:  Consultants:  General surgery  Procedures:  Lap chole 9/10   Antibiotics:  None    Subjective: Patient seen and examined, feels a whole better today, no nausea, vomiting, abdominal pain is improving, hungry, seen prior to the surgery  Objective: Weight change: 1.742 kg (3 lb 13.5 oz)  Intake/Output Summary (Last 24 hours) at 06/09/14 1008 Last data filed at 06/08/14 2300  Gross per 24 hour  Intake   1235 ml  Output      0 ml  Net   1235 ml   Blood pressure 95/49, pulse 83, temperature 98 F (36.7 C), temperature source Oral, resp. rate 16, height 5\' 6"  (1.676 m), weight 75.342 kg (166 lb 1.6 oz), SpO2 100.00%.  Physical Exam: General: Alert and awake, oriented x3,NAD CVS: S1-S2 clear, no murmur rubs or gallops Chest: clear to auscultation bilaterally, no wheezing, rales or rhonchi Abdomen: soft, NT, ND, NBS  Extremities: no cyanosis, clubbing or edema noted bilaterally  Lab Results: Basic Metabolic Panel:  Recent Labs Lab 06/08/14 0521 06/09/14 0447  NA 142 138  K 4.0 4.3  CL 105 106  CO2 24 18*  GLUCOSE 83 62*  BUN 9 13  CREATININE 0.86 0.77  CALCIUM 8.1* 7.8*   Liver Function Tests:  Recent Labs Lab 06/08/14 0521 06/09/14 0447  AST 151* 50*  ALT 451* 264*  ALKPHOS 148* 120*  BILITOT 1.0 0.8  PROT 6.1 5.8*  ALBUMIN 3.4* 3.0*    Recent  Labs Lab 06/08/14 0521 06/09/14 0447  LIPASE 1676* 453*   No results found for this basename: AMMONIA,  in the last 168 hours CBC:  Recent Labs Lab 06/07/14 1624 06/08/14 0521  WBC 11.9* 8.8  NEUTROABS 10.3*  --   HGB 16.5 14.1  HCT 45.4 40.8  MCV 85.3 88.1  PLT 252 220   Cardiac Enzymes: No results found for this basename: CKTOTAL, CKMB, CKMBINDEX, TROPONINI,  in the last 168 hours BNP: No components found with this basename: POCBNP,  CBG:  Recent Labs Lab 06/09/14 0631 06/09/14 0757  GLUCAP 116* 91     Micro Results: Recent Results (from the past 240 hour(s))  SURGICAL PCR SCREEN     Status: None   Collection Time    06/08/14  8:56 PM      Result Value Ref Range Status   MRSA, PCR NEGATIVE  NEGATIVE Final   Staphylococcus aureus NEGATIVE  NEGATIVE Final   Comment:            The Xpert SA Assay (FDA     approved for NASAL specimens     in patients over 6 years of age),     is one component of     a comprehensive surveillance     program.  Test performance has     been validated by [employer] for patients greater     than or  equal to 17 year old.     It is not intended     to diagnose infection nor to     guide or monitor treatment.    Studies/Results: US Abdomen Complete  06/07/2014   CLINICAL DATA:  Abdomen pain, nausea and vomiting.  EXAM: ULTRASOUND ABDOMEN COMPLETE  COMPARISON:  None.  FINDINGS: Gallbladder:  Gallstones are noted the gallbladder. No wall thickening visualized. No sonographic Murphy sign noted.  Common bile duct:  Diameter: 4.5 mm  Liver:  No focal lesion identified. Within normal limits in parenchymal echogenicity.  IVC:  No abnormality visualized.  Pancreas:  Visualized portion unremarkable.  Spleen:  Size and appearance within normal limits.  Right Kidney:  Length: 12.1 cm. Echogenicity within normal limits. No mass or hydronephrosis visualized.  Left Kidney:  Length: 12.8 cm. Echogenicity within normal limits. No mass or  hydronephrosis visualized.  Abdominal aorta:  No aneurysm visualized.  Other findings:  None.  IMPRESSION: Cholelithiasis without sonographic evidence of acute cholecystitis. No acute abnormality.   Electronically Signed   By: Abelardo Diesel M.D.   On: 06/07/2014 19:04    Medications: Scheduled Meds: . [MAR HOLD]  ceFAZolin (ANCEF) IV  2 g Intravenous 60 min Pre-Op  . diphenhydrAMINE  10 mg Intravenous Once  . [MAR HOLD] heparin  5,000 Units Subcutaneous 3 times per day  . [MAR HOLD] pantoprazole (PROTONIX) IV  40 mg Intravenous Q12H  . scopolamine  1 patch Transdermal Once  . [MAR HOLD] sodium chloride  3 mL Intravenous Q12H      LOS: 2 days   RAI,RIPUDEEP M.D. [facility] 06/09/2014, 10:08 AM Pager: [phone]  If 7PM-7AM, please contact night-coverage [email or web address] Password [email or web address]  **Disclaimer: This note was dictated with voice recognition software. Similar sounding words can inadvertently be transcribed and this note may contain transcription errors which may not have been corrected upon publication of note.**

## 2014-06-10 ENCOUNTER — Inpatient Hospital Stay (HOSPITAL_COMMUNITY): Payer: 59 | Admitting: Anesthesiology

## 2014-06-10 ENCOUNTER — Inpatient Hospital Stay (HOSPITAL_COMMUNITY): Payer: 59

## 2014-06-10 ENCOUNTER — Encounter (HOSPITAL_COMMUNITY): Payer: Self-pay

## 2014-06-10 ENCOUNTER — Encounter (HOSPITAL_COMMUNITY): Payer: 59 | Admitting: Anesthesiology

## 2014-06-10 ENCOUNTER — Encounter (HOSPITAL_COMMUNITY)
Admission: EM | Disposition: A | Discharge status: Home / Self Care | Payer: Self-pay | Source: Home / Self Care | Attending: Internal Medicine

## 2014-06-10 HISTORY — PX: ERCP: SHX5425

## 2014-06-10 LAB — COMPREHENSIVE METABOLIC PANEL
ALBUMIN: 2.9 g/dL — AB (ref 3.5–5.2)
ALT: 190 U/L — ABNORMAL HIGH (ref 0–53)
AST: 31 U/L (ref 0–37)
Alkaline Phosphatase: 112 U/L (ref 39–117)
Anion gap: 11 (ref 5–15)
BUN: 5 mg/dL — ABNORMAL LOW (ref 6–23)
CALCIUM: 8 mg/dL — AB (ref 8.4–10.5)
CO2: 25 mEq/L (ref 19–32)
CREATININE: 0.82 mg/dL (ref 0.50–1.35)
Chloride: 104 mEq/L (ref 96–112)
GFR calc Af Amer: >90 mL/min (ref >90)
GFR calc non Af Amer: >90 mL/min (ref >90)
Glucose, Bld: 107 mg/dL — ABNORMAL HIGH (ref 70–99)
Potassium: 3.8 mEq/L (ref 3.7–5.3)
Sodium: 140 mEq/L (ref 137–147)
TOTAL PROTEIN: 5.7 g/dL — AB (ref 6.0–8.3)
Total Bilirubin: 0.6 mg/dL (ref 0.3–1.2)

## 2014-06-10 LAB — LIPASE, BLOOD: LIPASE: 83 U/L — AB (ref 11–59)

## 2014-06-10 SURGERY — ERCP, WITH INTERVENTION IF INDICATED
Anesthesia: General

## 2014-06-10 MED ORDER — SODIUM CHLORIDE 0.9 % IV SOLN: INTRAVENOUS | Status: DC

## 2014-06-10 MED ORDER — MIDAZOLAM HCL 5 MG/5ML IJ SOLN
INTRAMUSCULAR | Status: DC | PRN
  Administered 2014-06-10: 2 mg via INTRAVENOUS

## 2014-06-10 MED ORDER — PROPOFOL 10 MG/ML IV BOLUS
INTRAVENOUS | Status: DC | PRN
  Administered 2014-06-10: 140 mg via INTRAVENOUS
  Administered 2014-06-10: 20 mg via INTRAVENOUS

## 2014-06-10 MED ORDER — PANTOPRAZOLE SODIUM 40 MG PO TBEC: 40.0000 mg | DELAYED_RELEASE_TABLET | Freq: Every day | ORAL | Status: DC

## 2014-06-10 MED ORDER — ONDANSETRON 4 MG PO TBDP: 4.0000 mg | ORAL_TABLET | Freq: Three times a day (TID) | ORAL | Status: DC | PRN

## 2014-06-10 MED ORDER — OXYCODONE-ACETAMINOPHEN 5-325 MG PO TABS: 1.0000 | ORAL_TABLET | ORAL | Status: DC | PRN

## 2014-06-10 MED ORDER — OXYCODONE-ACETAMINOPHEN 5-325 MG PO TABS
1.0000 | ORAL_TABLET | ORAL | Status: DC | PRN
Start: 2014-06-10 — End: 2014-06-10

## 2014-06-10 MED ORDER — GLUCAGON HCL RDNA (DIAGNOSTIC) 1 MG IJ SOLR
INTRAMUSCULAR | Status: DC | PRN
  Administered 2014-06-10: .5 mg via INTRAVENOUS

## 2014-06-10 MED ORDER — SODIUM CHLORIDE 0.9 % IV SOLN
INTRAVENOUS | Status: DC | PRN
  Administered 2014-06-10: 11:00:00

## 2014-06-10 MED ORDER — DEXAMETHASONE SODIUM PHOSPHATE 4 MG/ML IJ SOLN
INTRAMUSCULAR | Status: DC | PRN
  Administered 2014-06-10: 4 mg via INTRAVENOUS

## 2014-06-10 MED ORDER — FENTANYL CITRATE 0.05 MG/ML IJ SOLN
INTRAMUSCULAR | Status: DC | PRN
  Administered 2014-06-10: 50 ug via INTRAVENOUS
  Administered 2014-06-10: 100 ug via INTRAVENOUS

## 2014-06-10 MED ORDER — SUCCINYLCHOLINE CHLORIDE 20 MG/ML IJ SOLN
INTRAMUSCULAR | Status: DC | PRN
  Administered 2014-06-10: 50 mg via INTRAVENOUS

## 2014-06-10 MED ORDER — ONDANSETRON HCL 4 MG/2ML IJ SOLN
INTRAMUSCULAR | Status: DC | PRN
  Administered 2014-06-10: 4 mg via INTRAVENOUS

## 2014-06-10 MED ORDER — GLUCAGON HCL RDNA (DIAGNOSTIC) 1 MG IJ SOLR
INTRAMUSCULAR | Status: AC
  Filled 2014-06-10: qty 1

## 2014-06-10 MED ORDER — LACTATED RINGERS IV SOLN
INTRAVENOUS | Status: DC | PRN
  Administered 2014-06-10: 09:00:00 via INTRAVENOUS

## 2014-06-10 MED ORDER — LIDOCAINE HCL (CARDIAC) 20 MG/ML IV SOLN
INTRAVENOUS | Status: DC | PRN
  Administered 2014-06-10: 100 mg via INTRATRACHEAL
  Administered 2014-06-10: 60 mg via INTRAVENOUS

## 2014-06-10 MED ORDER — LACTATED RINGERS IV SOLN
INTRAVENOUS | Status: DC
  Administered 2014-06-10: 13:00:00 via INTRAVENOUS

## 2014-06-10 NOTE — Progress Notes (Signed)
Patient ID: Marcus [name], male   DOB: Nov 05, 1970, 43 y.o.   MRN: [id number]     [address] [place], [place] [address]    Phone: [phone] FAX: [phone]     Subjective: Had an ERCP today.  VSS.  Afebrile.  Pain well controlled.  Objective:  Vital signs:  Filed Vitals:   06/10/14 1140 06/10/14 1145 06/10/14 1150 06/10/14 1326  BP: 120/76 114/80  109/67  Pulse: 62 61 76 63  Temp:    98 F (36.7 C)  TempSrc:    Oral  Resp: 18 20  17   Height:      Weight:      SpO2: 100% 100% 99% 100%    Last BM Date: 06/07/14  Intake/Output   Yesterday:  09/10 0701 - 09/11 0700 In: 3273.3 [P.O.:200; I.V.:3073.3] Out: 20 [Blood:20] This shift:  Total I/O In: 715.8 [I.V.:715.8] Out: -    Physical Exam: General: Pt awake/alert/oriented x4 in no acute distress Abdomen: Soft.  Nondistended.  Incisions are c/d/i.  Mildly tender at incisions only.  No evidence of peritonitis.  No incarcerated hernias.    Problem List:   Principal Problem:   Pancreatitis, gallstone Active Problems:   History of duodenal ulcer   Asthma    Results:   Labs: Results for orders placed during the hospital encounter of 06/07/14 (from the past 58 hour(s))  SURGICAL PCR SCREEN     Status: None   Collection Time    06/08/14  8:56 PM      Result Value Ref Range   MRSA, PCR NEGATIVE  NEGATIVE   Staphylococcus aureus NEGATIVE  NEGATIVE   Comment:            The Xpert SA Assay (FDA     approved for NASAL specimens     in patients over 103 years of age),     is one component of     a comprehensive surveillance     program.  Test performance has     been validated by [employer] for patients greater     than or equal to 90 year old.     It is not intended     to diagnose infection nor to     guide or monitor treatment.  COMPREHENSIVE METABOLIC PANEL     Status: Abnormal   Collection Time    06/09/14  4:47 AM      Result  Value Ref Range   Sodium 138  137 - 147 mEq/L   Potassium 4.3  3.7 - 5.3 mEq/L   Chloride 106  96 - 112 mEq/L   CO2 18 (*) 19 - 32 mEq/L   Glucose, Bld 62 (*) 70 - 99 mg/dL   BUN 13  6 - 23 mg/dL   Creatinine, Ser 0.77  0.50 - 1.35 mg/dL   Calcium 7.8 (*) 8.4 - 10.5 mg/dL   Total Protein 5.8 (*) 6.0 - 8.3 g/dL   Albumin 3.0 (*) 3.5 - 5.2 g/dL   AST 50 (*) 0 - 37 U/L   ALT 264 (*) 0 - 53 U/L   Alkaline Phosphatase 120 (*) 39 - 117 U/L   Total Bilirubin 0.8  0.3 - 1.2 mg/dL   GFR calc non Af Amer >90  >90 mL/min   GFR calc Af Amer >90  >90 mL/min   Comment: (NOTE)     The eGFR  has been calculated using the CKD EPI equation.     This calculation has not been validated in all clinical situations.     eGFR's persistently <90 mL/min signify possible Chronic Kidney     Disease.   Anion gap 14  5 - 15  LIPASE, BLOOD     Status: Abnormal   Collection Time    06/09/14  4:47 AM      Result Value Ref Range   Lipase 453 (*) 11 - 59 U/L  GLUCOSE, CAPILLARY     Status: Abnormal   Collection Time    06/09/14  6:31 AM      Result Value Ref Range   Glucose-Capillary 116 (*) 70 - 99 mg/dL   Comment 1 Notify RN    GLUCOSE, CAPILLARY     Status: None   Collection Time    06/09/14  7:57 AM      Result Value Ref Range   Glucose-Capillary 91  70 - 99 mg/dL  GLUCOSE, CAPILLARY     Status: None   Collection Time    06/09/14 12:03 PM      Result Value Ref Range   Glucose-Capillary 95  70 - 99 mg/dL  GLUCOSE, CAPILLARY     Status: Abnormal   Collection Time    06/09/14  3:14 PM      Result Value Ref Range   Glucose-Capillary 125 (*) 70 - 99 mg/dL  COMPREHENSIVE METABOLIC PANEL     Status: Abnormal   Collection Time    06/10/14  5:00 AM      Result Value Ref Range   Sodium 140  137 - 147 mEq/L   Potassium 3.8  3.7 - 5.3 mEq/L   Chloride 104  96 - 112 mEq/L   CO2 25  19 - 32 mEq/L   Glucose, Bld 107 (*) 70 - 99 mg/dL   BUN 5 (*) 6 - 23 mg/dL   Creatinine, Ser 0.82  0.50 - 1.35 mg/dL    Calcium 8.0 (*) 8.4 - 10.5 mg/dL   Total Protein 5.7 (*) 6.0 - 8.3 g/dL   Albumin 2.9 (*) 3.5 - 5.2 g/dL   AST 31  0 - 37 U/L   ALT 190 (*) 0 - 53 U/L   Alkaline Phosphatase 112  39 - 117 U/L   Total Bilirubin 0.6  0.3 - 1.2 mg/dL   GFR calc non Af Amer >90  >90 mL/min   GFR calc Af Amer >90  >90 mL/min   Comment: (NOTE)     The eGFR has been calculated using the CKD EPI equation.     This calculation has not been validated in all clinical situations.     eGFR's persistently <90 mL/min signify possible Chronic Kidney     Disease.   Anion gap 11  5 - 15  LIPASE, BLOOD     Status: Abnormal   Collection Time    06/10/14  5:00 AM      Result Value Ref Range   Lipase 83 (*) 11 - 59 U/L    Imaging / Studies: Dg Cholangiogram Operative  06/09/2014   CLINICAL DATA:  Cholelithiasis.  EXAM: INTRAOPERATIVE CHOLANGIOGRAM  TECHNIQUE: Cholangiographic images from the C-arm fluoroscopic device were submitted for interpretation post-operatively. Please see the procedural report for the amount of contrast and the fluoroscopy time utilized.  COMPARISON:  Ultrasound 06/07/2014  FINDINGS: Contrast fills the intrahepatic and extrahepatic biliary system. Small mobile filling defects within the extrahepatic biliary system may be  related to gas bubbles. However, there is irregularity of the distal common bile duct and no contrast drainage into the duodenum. On the later images, there is a small amount of contrast extravasation near the injection site.  IMPRESSION: Irregularity and obstruction at the distal common bile duct. Findings are suspicious for an obstructing stone.  Small mobile filling defects in the extrahepatic biliary system could represent small stones or gas bubbles.   Electronically Signed   By: Markus Daft M.D.   On: 06/09/2014 12:15   Dg Ercp Biliary & Pancreatic Ducts  06/10/2014   CLINICAL DATA:  Abnormal intraoperative cholangiogram.  EXAM: ERCP  TECHNIQUE: Multiple spot images obtained with  the fluoroscopic device and submitted for interpretation post-procedure.  COMPARISON:  Intraoperative cholangiogram 06/09/2014  FINDINGS: There is a filling defect at the distal common bile duct compatible with a stone. Small amount of contrast goes into the pancreatic duct. Wire was advanced into the common bile duct and evidence for a sphincterotomy procedure. A balloon sweep was performed. No evidence for a retained stone on the final cholangiogram.  IMPRESSION: Removal of distal common bile duct stone.  These images were submitted for radiologic interpretation only. Please see the procedural report for the amount of contrast and the fluoroscopy time utilized.   Electronically Signed   By: Markus Daft M.D.   On: 06/10/2014 11:58    Medications / Allergies:  Scheduled Meds: . heparin  5,000 Units Subcutaneous 3 times per day  . pantoprazole (PROTONIX) IV  40 mg Intravenous Q12H  . sodium chloride  3 mL Intravenous Q12H   Continuous Infusions: . sodium chloride    . dextrose 5 % and 0.45% NaCl Stopped (06/10/14 1249)  . lactated ringers 175 mL/hr at 06/10/14 1246   PRN Meds:.sodium chloride, morphine injection, ondansetron, oxyCODONE-acetaminophen, sodium chloride  Antibiotics: Anti-infectives   Start     Dose/Rate Route Frequency Ordered Stop   06/10/14 0800  [MAR Hold]  Ampicillin-Sulbactam (UNASYN) 3 g in sodium chloride 0.9 % 100 mL IVPB     (On MAR Hold since 06/10/14 0823)   3 g 100 mL/hr over 60 Minutes Intravenous  Once 06/09/14 2318 06/10/14 0949   06/09/14 0600  [MAR Hold]  ceFAZolin (ANCEF) IVPB 2 g/50 mL premix     (On MAR Hold since 06/09/14 0854)   2 g 100 mL/hr over 30 Minutes Intravenous 60 min pre-op 06/08/14 1001 06/09/14 1003        Assessment/Plan POD#1 laparoscopic cholecystectomy with IOC---Dr. Ninfa Linden -tolerating po's, pain well controlled, voiding, passing flatus, VSS.  Meets criteria for discharge. -follow up in 3 weeks for a post op check -instructions  provided -Rx for percocet and work statement provided. -pt encouraged to call with questions or concerns. -D/w Dr. Tana Coast.    Erby Pian, [facility] Surgery Pager [phone]) For consults and floor pages call [phone](7A-4:30P)  06/10/2014 3:21 PM

## 2014-06-10 NOTE — Interval H&P Note (Signed)
History and Physical Interval Note:  06/10/2014 9:42 AM  Marcus [name]  has presented today for surgery, with the diagnosis of choledocholithiasis  The various methods of treatment have been discussed with the patient (in detail today preprocedure) and family. After consideration of risks, benefits and other options for treatment, the patient has consented to  Procedure(s): ENDOSCOPIC RETROGRADE CHOLANGIOPANCREATOGRAPHY (ERCP) (N/A) as a surgical intervention .  The patient's history has been reviewed, patient examined, no change in status, stable for surgery.  I have reviewed the patient's chart and labs.  Questions were answered to the patient's satisfaction.     Scarlette Shorts

## 2014-06-10 NOTE — Discharge Instructions (Signed)

## 2014-06-10 NOTE — Anesthesia Procedure Notes (Signed)
Procedure Name: Intubation Date/Time: 06/10/2014 10:03 AM Performed by: Luciana Axe K Pre-anesthesia Checklist: Patient identified, Emergency Drugs available, Suction available, Timeout performed and Patient being monitored Patient Re-evaluated:Patient Re-evaluated prior to inductionOxygen Delivery Method: Circle system utilized Intubation Type: IV induction Ventilation: Mask ventilation without difficulty Laryngoscope Size: Miller and 2 Grade View: Grade III Tube type: Oral Tube size: 7.0 mm Number of attempts: 1 Airway Equipment and Method: Stylet Placement Confirmation: ETT inserted through vocal cords under direct vision,  positive ETCO2,  CO2 detector and breath sounds checked- equal and bilateral Secured at: 21 cm Tube secured with: Tape Dental Injury: Teeth and Oropharynx as per pre-operative assessment

## 2014-06-10 NOTE — Anesthesia Postprocedure Evaluation (Signed)
  Anesthesia Post-op Note  Patient: Marcus [name]  Procedure(s) Performed: Procedure(s): ENDOSCOPIC RETROGRADE CHOLANGIOPANCREATOGRAPHY (ERCP) (N/A)  Patient Location: PACU  Anesthesia Type:General  Level of Consciousness: awake and alert   Airway and Oxygen Therapy: Patient Spontanous Breathing  Post-op Pain: mild  Post-op Assessment: Post-op Vital signs reviewed, Patient's Cardiovascular Status Stable, Respiratory Function Stable, Patent Airway, No signs of Nausea or vomiting and Pain level controlled  Post-op Vital Signs: Reviewed and stable  Last Vitals:  Filed Vitals:   06/10/14 1326  BP: 109/67  Pulse: 63  Temp: 36.7 C  Resp: 17    Complications: No apparent anesthesia complications

## 2014-06-10 NOTE — Progress Notes (Signed)
Pt. Discharge to home. Discharge instructions given to patient.  No question verbalized. 

## 2014-06-10 NOTE — Transfer of Care (Signed)
Immediate Anesthesia Transfer of Care Note  Patient: Marcus [name]  Procedure(s) Performed: Procedure(s): ENDOSCOPIC RETROGRADE CHOLANGIOPANCREATOGRAPHY (ERCP) (N/A)  Patient Location: PACU and Endoscopy Unit  Anesthesia Type:General  Level of Consciousness: awake, alert , oriented and patient cooperative  Airway & Oxygen Therapy: Patient Spontanous Breathing and Patient connected to nasal cannula oxygen  Post-op Assessment: Report given to PACU RN and Post -op Vital signs reviewed and stable  Post vital signs: Reviewed  Complications: No apparent anesthesia complications

## 2014-06-10 NOTE — Discharge Summary (Signed)
Physician Discharge Summary  Patient ID: Marcus [name] MRN: [id number] DOB/AGE: Feb 08, 1971 43 y.o.  Admit date: 06/07/2014 Discharge date: 06/10/2014  Primary Care Physician:  Ria Bush, MD  Discharge Diagnoses:     . Acute Pancreatitis, gallstone . Asthma transamnitis  History of duodenal ulcer  Consults:  Surgery, Dr Ninfa Linden                     GI, Dr Henrene Pastor    Allergies:  No Known Allergies   Discharge Medications:   Medication List         ondansetron 4 MG disintegrating tablet  Commonly known as:  ZOFRAN-ODT  Take 1 tablet (4 mg total) by mouth every 8 (eight) hours as needed for nausea.     oxyCODONE-acetaminophen 5-325 MG per tablet  Commonly known as:  PERCOCET/ROXICET  Take 1 tablet by mouth every 4 (four) hours as needed for moderate pain.     pantoprazole 40 MG tablet  Commonly known as:  PROTONIX  Take 1 tablet (40 mg total) by mouth daily.         Brief H and P: For complete details please refer to admission H and P, but in brief Marcus [name] is a 43 y.o. male, with a past medical history of recurrent abdominal pain due to gallstone, who presents with nausea vomiting and abdominal pain.  The patient reports that he had recurrent abdominal pain due to gallstone. His first episode was 6 months ago when he was working in [place]. Then he had another episode 4 months ago. He was seen by surgeon, Dr. Johney Maine. He is in the process of being scheduled for a surgery, but obviously has not been done yet. 2 days ago, he started having nausea, vomiting and abdominal pain again. He vomited several times without blood in the vomitus. His abdominal pain was located at the epigastric area, 10 out of 10 in severity, constant, stabbing-like pain. No radiation. It was aggravated by eating food. He took some Percocet at home which gave him some relief. He does not have fever, chills or diarrhea. Denies any symptoms for UTI. He did not notice any blood in his stool or  urine.  US-abdomen was done in ED, which showed cholelithiasis without sonographic evidence of acute cholecystitis. His lipase was elevated, at >3000 and had mildly elevated WBC 11.9.   Hospital Course:  Acute Pancreatitis, gallstone: Postop day 1, after laparoscopic cholecystectomy  - Lipase down to 83, general surgery was consulted, patient underwent laparoscopic cholecystectomy on  06/09/14. Cholangiogram showed irregularity and obstruction at the distal common bile duct, possible obstructing stone. Gastroenterology was consulted.  Patient underwent ERCP with sphincterotomy and stone extraction. He is tolerating diet and cleared by surgery and GI for DC home.  Elevated LFTs: Likely due to cholestasis from gallstones, improving today   History of duodenal ulcer : continue oral protonix  Asthma  - Currently stable       Day of Discharge BP 109/67  Pulse 63  Temp(Src) 98 F (36.7 C) (Oral)  Resp 17  Ht 5\' 6"  (1.676 m)  Wt 75.8 kg (167 lb 1.7 oz)  BMI 26.98 kg/m2  SpO2 100%  Physical Exam: General: Alert and awake oriented x3 not in any acute distress. CVS: S1-S2 clear no murmur rubs or gallops Chest: clear to auscultation bilaterally, no wheezing rales or rhonchi Abdomen: soft nontender, nondistended, normal bowel sounds Extremities: no cyanosis, clubbing or edema noted bilaterally Neuro: Cranial nerves II-XII intact, no  focal neurological deficits   The results of significant diagnostics from this hospitalization (including imaging, microbiology, ancillary and laboratory) are listed below for reference.    LAB RESULTS: Basic Metabolic Panel:  Recent Labs Lab 06/09/14 0447 06/10/14 0500  NA 138 140  K 4.3 3.8  CL 106 104  CO2 18* 25  GLUCOSE 62* 107*  BUN 13 5*  CREATININE 0.77 0.82  CALCIUM 7.8* 8.0*   Liver Function Tests:  Recent Labs Lab 06/09/14 0447 06/10/14 0500  AST 50* 31  ALT 264* 190*  ALKPHOS 120* 112  BILITOT 0.8 0.6  PROT 5.8* 5.7*   ALBUMIN 3.0* 2.9*    Recent Labs Lab 06/09/14 0447 06/10/14 0500  LIPASE 453* 83*   No results found for this basename: AMMONIA,  in the last 168 hours CBC:  Recent Labs Lab 06/07/14 1624 06/08/14 0521  WBC 11.9* 8.8  NEUTROABS 10.3*  --   HGB 16.5 14.1  HCT 45.4 40.8  MCV 85.3 88.1  PLT 252 220   Cardiac Enzymes: No results found for this basename: CKTOTAL, CKMB, CKMBINDEX, TROPONINI,  in the last 168 hours BNP: No components found with this basename: POCBNP,  CBG:  Recent Labs Lab 06/09/14 1203 06/09/14 1514  GLUCAP 95 125*    Significant Diagnostic Studies:  US Abdomen Complete  06/07/2014   CLINICAL DATA:  Abdomen pain, nausea and vomiting.  EXAM: ULTRASOUND ABDOMEN COMPLETE  COMPARISON:  None.  FINDINGS: Gallbladder:  Gallstones are noted the gallbladder. No wall thickening visualized. No sonographic Murphy sign noted.  Common bile duct:  Diameter: 4.5 mm  Liver:  No focal lesion identified. Within normal limits in parenchymal echogenicity.  IVC:  No abnormality visualized.  Pancreas:  Visualized portion unremarkable.  Spleen:  Size and appearance within normal limits.  Right Kidney:  Length: 12.1 cm. Echogenicity within normal limits. No mass or hydronephrosis visualized.  Left Kidney:  Length: 12.8 cm. Echogenicity within normal limits. No mass or hydronephrosis visualized.  Abdominal aorta:  No aneurysm visualized.  Other findings:  None.  IMPRESSION: Cholelithiasis without sonographic evidence of acute cholecystitis. No acute abnormality.   Electronically Signed   By: Abelardo Diesel M.D.   On: 06/07/2014 19:04      Disposition and Follow-up:    DISPOSITION: home  DIET:soft solids   DISCHARGE FOLLOW-UP     Follow-up Information   Follow up with Ria Bush, MD. Schedule an appointment as soon as possible for a visit in 2 weeks. (for hospital follow-up)    Specialty:  Family Medicine   Contact information:   [place]  [address] [phone]       Follow up with [facility]. (our office will call you to schedule a follow up, if you do not hear from [place] by monday evening, please call.)    Contact information:   [address] [place]   [place] [address] [phone]       Time spent on Discharge: 35 mins  Signed:   RAI,RIPUDEEP M.D. [facility] 06/10/2014, 3:02 PM Pager: [phone]   **Disclaimer: This note was dictated with voice recognition software. Similar sounding words can inadvertently be transcribed and this note may contain transcription errors which may not have been corrected upon publication of note.**

## 2014-06-10 NOTE — Progress Notes (Signed)
Patient tolerated the clear liquid diet. Denies any pain. Had been voiding and passing gas. Advancing diet to soft solids as ordered for dinner.

## 2014-06-10 NOTE — Progress Notes (Signed)
Patient ID: Marcus [name]  male  YPP:[id number]    DOB: 07-Jun-1971    DOA: 06/07/2014  PCP: Ria Bush, MD  Assessment/Plan: Principal Problem:  Acute Pancreatitis, gallstone: Postop day 1, after laparoscopic cholecystectomy - Lipase down to 83, general surgery was consulted, patient underwent laparoscopic on a 9/10. Cholangiogram showed irregularity and obstruction at the distal common bile duct, possible obstructing stone. Gastroenterology was consulted.  - Patient underwent ERCP with sphincterotomy and stone extraction. - Will start on diet, monitor closely, possible DC'd later in the evening or a.m.   Active Problems: Elevated LFTs: Likely due to cholestasis from gallstones, improving today    History of duodenal ulcer - on IV PPI    Asthma - Currently stable  DVT Prophylaxis:  Code Status:  Family Communication: Discussed with patient   Disposition:  Consultants:  General surgery  Procedures:  Lap chole 9/10   Antibiotics:  None    Subjective: Patient seen and examined,  Feels better, awaiting ERCP at the time of my encounter today, denies any significant nausea, vomiting or abdominal pain   Objective: Weight change: 0.458 kg (1 lb 0.1 oz)  Intake/Output Summary (Last 24 hours) at 06/10/14 1116 Last data filed at 06/10/14 1031  Gross per 24 hour  Intake 2773.33 ml  Output      0 ml  Net 2773.33 ml   Blood pressure 137/77, pulse 78, temperature 97.9 F (36.6 C), temperature source Oral, resp. rate 16, height 5\' 6"  (1.676 m), weight 75.8 kg (167 lb 1.7 oz), SpO2 100.00%.  Physical Exam: General: Alert and awake, oriented x3,NAD CVS: S1-S2 clear, no murmur rubs or gallops Chest: CTAB Abdomen: soft, NT, ND, NBS  Extremities: no cyanosis, clubbing or edema noted bilaterally  Lab Results: Basic Metabolic Panel:  Recent Labs Lab 06/09/14 0447 06/10/14 0500  NA 138 140  K 4.3 3.8  CL 106 104  CO2 18* 25  GLUCOSE 62* 107*  BUN 13 5*    CREATININE 0.77 0.82  CALCIUM 7.8* 8.0*   Liver Function Tests:  Recent Labs Lab 06/09/14 0447 06/10/14 0500  AST 50* 31  ALT 264* 190*  ALKPHOS 120* 112  BILITOT 0.8 0.6  PROT 5.8* 5.7*  ALBUMIN 3.0* 2.9*    Recent Labs Lab 06/09/14 0447 06/10/14 0500  LIPASE 453* 83*   No results found for this basename: AMMONIA,  in the last 168 hours CBC:  Recent Labs Lab 06/07/14 1624 06/08/14 0521  WBC 11.9* 8.8  NEUTROABS 10.3*  --   HGB 16.5 14.1  HCT 45.4 40.8  MCV 85.3 88.1  PLT 252 220   Cardiac Enzymes: No results found for this basename: CKTOTAL, CKMB, CKMBINDEX, TROPONINI,  in the last 168 hours BNP: No components found with this basename: POCBNP,  CBG:  Recent Labs Lab 06/09/14 0631 06/09/14 0757 06/09/14 1203 06/09/14 1514  GLUCAP 116* 91 95 125*     Micro Results: Recent Results (from the past 240 hour(s))  SURGICAL PCR SCREEN     Status: None   Collection Time    06/08/14  8:56 PM      Result Value Ref Range Status   MRSA, PCR NEGATIVE  NEGATIVE Final   Staphylococcus aureus NEGATIVE  NEGATIVE Final   Comment:            The Xpert SA Assay (FDA     approved for NASAL specimens     in patients over 79 years of age),     is one  component of     a comprehensive surveillance     program.  Test performance has     been validated by [facility] for patients greater     than or equal to 42 year old.     It is not intended     to diagnose infection nor to     guide or monitor treatment.    Studies/Results: US Abdomen Complete  06/07/2014   CLINICAL DATA:  Abdomen pain, nausea and vomiting.  EXAM: ULTRASOUND ABDOMEN COMPLETE  COMPARISON:  None.  FINDINGS: Gallbladder:  Gallstones are noted the gallbladder. No wall thickening visualized. No sonographic Murphy sign noted.  Common bile duct:  Diameter: 4.5 mm  Liver:  No focal lesion identified. Within normal limits in parenchymal echogenicity.  IVC:  No abnormality visualized.  Pancreas:   Visualized portion unremarkable.  Spleen:  Size and appearance within normal limits.  Right Kidney:  Length: 12.1 cm. Echogenicity within normal limits. No mass or hydronephrosis visualized.  Left Kidney:  Length: 12.8 cm. Echogenicity within normal limits. No mass or hydronephrosis visualized.  Abdominal aorta:  No aneurysm visualized.  Other findings:  None.  IMPRESSION: Cholelithiasis without sonographic evidence of acute cholecystitis. No acute abnormality.   Electronically Signed   By: Abelardo Diesel M.D.   On: 06/07/2014 19:04    Medications: Scheduled Meds: . [MAR HOLD] heparin  5,000 Units Subcutaneous 3 times per day  . [facility] HOLD] indomethacin  100 mg Rectal Once  . [MAR HOLD] pantoprazole (PROTONIX) IV  40 mg Intravenous Q12H  . [MAR HOLD] sodium chloride  3 mL Intravenous Q12H      LOS: 3 days   RAI,RIPUDEEP M.D. [facility] 06/10/2014, 11:16 AM Pager: [phone]  If 7PM-7AM, please contact night-coverage [email or web address] Password [email or web address]  **Disclaimer: This note was dictated with voice recognition software. Similar sounding words can inadvertently be transcribed and this note may contain transcription errors which may not have been corrected upon publication of note.**

## 2014-06-10 NOTE — Progress Notes (Signed)
I have seen and examined the patient and agree with the assessment and plans.  Johnthomas Lader A. Bonnie Overdorf  MD, FACS  

## 2014-06-10 NOTE — Op Note (Signed)
[facility] [place], [id number]   ERCP PROCEDURE REPORT  PATIENT: Marcus [name], Marcus [name]  MR# :[id number] BIRTHDATE: 10/11/1970  GENDER: Male ENDOSCOPIST: [name], MD REFERRED BY: [name], M.D. PROCEDURE DATE:  06/10/2014 PROCEDURE:   ERCP with sphincterotomy/papillotomy and ERCP with removal of calculus/calculi ASA CLASS:   Class II INDICATIONS:suspected or rule out bile duct stones.   abnormal intraoperative cholangiogram .   abnormal liver function test . MEDICATIONS: See Anesthesia Report. TOPICAL ANESTHETIC: none  DESCRIPTION OF PROCEDURE:   After the risks benefits and alternatives of the procedure were thoroughly explained, informed consent was obtained.  The Pentax Ercp Scope [id number]  endoscope was introduced through the mouth  and advanced to the second portion of the duodenum .  EXAM: The side-viewing endoscope was passed blindly into the esophagus which was not examined.  The stomach was normal.  The duodenum was normal.  The major ampulla was normal.  The minor ampulla was not sought. X-ray: Scott radiograph of the abdomen with the endoscope in position revealed cholecystectomy clips.  Initial injection of contrast yielded partial distal pancreatic gram and cholangiogram. The common bile duct was then selectively and deeply cannulated. Injection of contrast revealed no obvious dominant filling defects though there appeared to be floating sludge distally. THERAPY: With the hydrophilic guidewire and position, a biliary sphincterotomy was made with cutting in the 12:00 orientation via the ERBE system.  The sphincterotome was exchanged for sequential balloon.  The balloon was passed through the bile duct several times.  Tiny bits of debris indeed extracted.  Post extraction occlusion cholangiogram was negative.  Excellent drainage. The scope was then completely withdrawn from the patient and the procedure  terminated.     COMPLICATIONS: .  There were no complications.  ENDOSCOPIC IMPRESSION: 1. ERCP with sphincterotomy and stone (gravel) extraction 2. Status post cholecystectomy 3. Recent gallstone pancreatitis. Improving.  RECOMMENDATIONS: 1. Standard post procedure observation 2. Prophylactic indomethacin suppositories 3. Anticipate discharge home in a.m., or late this afternoon, if doing well.   _______________________________ eSigned:  [name], MD 06/10/2014 11:00 AM   [id number] Ninfa Linden, MD The Patient

## 2014-06-10 NOTE — Anesthesia Preprocedure Evaluation (Addendum)
Anesthesia Evaluation  Patient identified by MRN, date of birth, ID band Patient awake    Reviewed: Allergy & Precautions, H&P , NPO status , Patient's Chart, lab work & pertinent test results  History of Anesthesia Complications Negative for: history of anesthetic complications  Airway Mallampati: I TM Distance: >3 FB Neck ROM: Full    Dental  (+) Dental Advisory Given   Pulmonary neg pulmonary ROS,  breath sounds clear to auscultation  Pulmonary exam normal       Cardiovascular negative cardio ROS  Rhythm:Regular Rate:Normal     Neuro/Psych negative neurological ROS     GI/Hepatic Neg liver ROS, GERD-  Controlled and Medicated,Choledochocystitis: elevated LFTs   Endo/Other  negative endocrine ROS  Renal/GU negative Renal ROS     Musculoskeletal   Abdominal   Peds  Hematology   Anesthesia Other Findings   Reproductive/Obstetrics                           Anesthesia Physical  Anesthesia Plan  ASA: I  Anesthesia Plan: General   Post-op Pain Management:    Induction: Intravenous  Airway Management Planned: Oral ETT  Additional Equipment:   Intra-op Plan:   Post-operative Plan: Extubation in OR  Informed Consent: I have reviewed the patients History and Physical, chart, labs and discussed the procedure including the risks, benefits and alternatives for the proposed anesthesia with the patient or authorized representative who has indicated his/her understanding and acceptance.   Dental advisory given  Plan Discussed with: CRNA and Surgeon  Anesthesia Plan Comments:         Anesthesia Quick Evaluation

## 2014-06-10 NOTE — H&P (View-Only) (Signed)
[facility] Consult: 11:58 AM 06/09/2014  LOS: 2 days    Referring Provider: Dr Ninfa Linden  Primary Care Physician:  Ria Bush, MD Primary Gastroenterologist:  unassigned    Reason for Consultation:  Positive IOC   HPI: Marcus [name] is a 43 y.o. male.  Had DU in 2005.  On no PPI PTA.  Admitted yesterday with epigastric, RUQ, LUQ abdominal pain. Had similar episodes x 3 in last 6 months. Abnormal LFTs, Lipase > 1600.  Gallstones on ultrasound, CBD 4.5 mm, pancreas not well seen.   This morning had ERCP with IOC, latter showed "Contrast would not flow into the duodenum despite multiple attempts. There appeared to be a distal CBD stone present."   Pt currently pain free and hungry.     Past Medical History  Diagnosis Date  . Wrist fracture, left 2011  . History of duodenal ulcer 2005    worse with stress  . Asthma     Past Surgical History  Procedure Laterality Date  . Wrist fracture surgery  2011    hardware (pins)  . Tonsillectomy  1980 (?)    Prior to Admission medications   Medication Sig Start Date End Date Taking? Authorizing Provider  ondansetron (ZOFRAN-ODT) 4 MG disintegrating tablet Take 4 mg by mouth every 8 (eight) hours as needed for nausea.  01/14/14  Yes Historical Provider, MD  oxyCODONE-acetaminophen (PERCOCET/ROXICET) 5-325 MG per tablet Take 1 tablet by mouth every 4 (four) hours as needed for moderate pain.  01/14/14  Yes Historical Provider, MD    Scheduled Meds: . heparin  5,000 Units Subcutaneous 3 times per day  . HYDROmorphone      . pantoprazole (PROTONIX) IV  40 mg Intravenous Q12H  . sodium chloride  3 mL Intravenous Q12H   Infusions: . dextrose 5 % and 0.45% NaCl 100 mL/hr at 06/09/14 0716  . lactated ringers 50 mL/hr at 06/09/14 0920   PRN Meds: sodium  chloride, morphine injection, ondansetron, sodium chloride   Allergies as of 06/07/2014  . (No Known Allergies)    Family History  Problem Relation Age of Onset  . Diabetes Maternal Grandmother   . CAD Maternal Grandmother   . Cancer Neg Hx   . Stroke Neg Hx     History   Social History  . Marital Status: Married    Spouse Name: N/A    Number of Children: N/A  . Years of Education: N/A   Occupational History  . Not on file.   Social History Main Topics  . Smoking status: Never Smoker   . Smokeless tobacco: Never Used  . Alcohol Use: Yes     Comment: Occasional  . Drug Use: No  . Sexual Activity: Not on file   Other Topics Concern  . Not on file   Social History Narrative   Spanish speaking, from [place]   Lives with wife and 2 sons and [place]   From [place], [place].     Occupation: works with [employer]   Edu: some college Administrator)   Activity: 2x/wk -  trail running   Diet: good water, fruits/vegetables daily    REVIEW OF SYSTEMS: Constitutional:  No weakness.  ENT:  No nose bleeds Pulm:  No sob or cough CV:  No palpitations, no LE edema.  GU:  No hematuria, no frequency GI:  Per hpi.  No dysphagia.  Not sure if he was H pylori + in past Heme:  No anemia   Transfusions:  none Neuro:  No headaches, no peripheral tingling or numbness Derm:  No itching, no rash or sores.  Endocrine:  No sweats or chills.  No polyuria or dysuria Immunization:  Not queried.  Travel:  None beyond local counties in last few months.    PHYSICAL EXAM: Vital signs in last 24 hours: Filed Vitals:   06/09/14 1147  BP: 121/76  Pulse: 62  Temp: 98.6 F (37 C)  Resp: 22   Wt Readings from Last 3 Encounters:  06/09/14 75.342 kg (166 lb 1.6 oz)  06/09/14 75.342 kg (166 lb 1.6 oz)  04/29/14 73.596 kg (162 lb 4 oz)    General: pleasant, alert, looks well.  Excellent Englis Head:  No asyymetry or swelling  Eyes:  No icterus or pallor Ears:  Not HOH    Nose:  No congestion Mouth:  Clear and moist Neck:  No mass, no JVD Lungs:  Clear bil.  No cough or SOB Heart: RRR.  No MRG Abdomen:  Soft, BS absent, ND.  Some blood on incision bandages.  Mild tenderness in RUQ.   Rectal: not done   Musc/Skeltl: no joint swelling or deformity Extremities:  No CCE  Neurologic:  Pleasant, oriented x3.  Good historian.  No gross deficits no tremor.  No limbweakness Skin:  No jaundice or rash Tattoos:  none Nodes:  No cervical adenopathy   Psych:  Pleasant, relaxed, cooperative.   Intake/Output from previous day: 09/09 0701 - 09/10 0700 In: 1235 [I.V.:1235] Out: -  Intake/Output this shift: Total I/O In: 1000 [I.V.:1000] Out: 20 [Blood:20]  LAB RESULTS:  Recent Labs  06/07/14 1624 06/08/14 0521  WBC 11.9* 8.8  HGB 16.5 14.1  HCT 45.4 40.8  PLT 252 220   BMET Lab Results  Component Value Date   NA 138 06/09/2014   NA 142 06/08/2014   NA 135* 06/07/2014   K 4.3 06/09/2014   K 4.0 06/08/2014   K 3.7 06/07/2014   CL 106 06/09/2014   CL 105 06/08/2014   CL 96 06/07/2014   CO2 18* 06/09/2014   CO2 24 06/08/2014   CO2 22 06/07/2014   GLUCOSE 62* 06/09/2014   GLUCOSE 83 06/08/2014   GLUCOSE 187* 06/07/2014   BUN 13 06/09/2014   BUN 9 06/08/2014   BUN 11 06/07/2014   CREATININE 0.77 06/09/2014   CREATININE 0.86 06/08/2014   CREATININE 0.98 06/07/2014   CALCIUM 7.8* 06/09/2014   CALCIUM 8.1* 06/08/2014   CALCIUM 9.6 06/07/2014   LFT  Recent Labs  06/07/14 1624 06/08/14 0521 06/09/14 0447  PROT 7.5 6.1 5.8*  ALBUMIN 4.3 3.4* 3.0*  AST 390* 151* 50*  ALT 640* 451* 264*  ALKPHOS 180* 148* 120*  BILITOT 3.0* 1.0 0.8   PT/INR Lab Results  Component Value Date   INR 1.07 06/07/2014   Hepatitis Panel No results found for this basename: HEPBSAG, HCVAB, HEPAIGM, HEPBIGM,  in the last 72 hours C-Diff No components found with this basename: cdiff   Lipase     Component Value Date/Time   LIPASE 453* 06/09/2014 0447  Drugs of Abuse  No results found for  this basename: labopia, cocainscrnur, labbenz, amphetmu, thcu, labbarb     RADIOLOGY STUDIES: US Abdomen Complete  06/07/2014   CLINICAL DATA:  Abdomen pain, nausea and vomiting.  EXAM: ULTRASOUND ABDOMEN COMPLETE  COMPARISON:  None.  FINDINGS: Gallbladder:  Gallstones are noted the gallbladder. No wall thickening visualized. No sonographic Murphy sign noted.  Common bile duct:  Diameter: 4.5 mm  Liver:  No focal lesion identified. Within normal limits in parenchymal echogenicity.  IVC:  No abnormality visualized.  Pancreas:  Visualized portion unremarkable.  Spleen:  Size and appearance within normal limits.  Right Kidney:  Length: 12.1 cm. Echogenicity within normal limits. No mass or hydronephrosis visualized.  Left Kidney:  Length: 12.8 cm. Echogenicity within normal limits. No mass or hydronephrosis visualized.  Abdominal aorta:  No aneurysm visualized.  Other findings:  None.  IMPRESSION: Cholelithiasis without sonographic evidence of acute cholecystitis. No acute abnormality.   Electronically Signed   By: Abelardo Diesel M.D.   On: 06/07/2014 19:04    ENDOSCOPIC STUDIES: None  IMPRESSION:   *  Biliary pancreatitis.  *  S/p lap chole 9/10  *  Abnormal IOC, suspect stone in CBD.     PLAN:     *  Per Dr Henrene Pastor. Has slot for ERCP set for 0900 but wait for Dr Henrene Pastor to confirm this.  *  Discussed risks, benefits with pt and he is agreeable to proceed.   *  Indocin pr after ERCP.  *  Clears tonight.    Azucena Freed  06/09/2014, 11:58 AM Pager: [phone]  GI ATTENDING  History, laboratories, x-rays reviewed. Case discussed with Dr. Ninfa Linden. Agree with H&P as above. He does appear to have stone debris in the distal bile duct without overwhelming stone. We'll proceed with ERCP and possible sphincterotomy with stone extraction.The nature of the procedure, as well as the risks, benefits, and alternatives were carefully and thoroughly reviewed with the patient. Ample time for discussion and  questions allowed. The patient understood, was satisfied, and agreed to proceed.  Docia Chuck. Geri Seminole., M.D. [facility] Division of Gastroenterology

## 2014-06-13 ENCOUNTER — Encounter (HOSPITAL_COMMUNITY): Payer: Self-pay | Admitting: Internal Medicine

## 2014-06-16 ENCOUNTER — Ambulatory Visit: Payer: 59 | Admitting: Family Medicine

## 2014-06-20 ENCOUNTER — Ambulatory Visit (INDEPENDENT_AMBULATORY_CARE_PROVIDER_SITE_OTHER): Payer: 59 | Admitting: Family Medicine

## 2014-06-20 ENCOUNTER — Encounter: Payer: Self-pay | Admitting: Family Medicine

## 2014-06-20 VITALS — BP 100/68 | HR 65 | Temp 98.1°F | Wt 160.4 lb

## 2014-06-20 DIAGNOSIS — K859 Acute pancreatitis without necrosis or infection, unspecified: Secondary | ICD-10-CM

## 2014-06-20 DIAGNOSIS — K603 Anal fistula: Secondary | ICD-10-CM

## 2014-06-20 DIAGNOSIS — K8020 Calculus of gallbladder without cholecystitis without obstruction: Secondary | ICD-10-CM

## 2014-06-20 DIAGNOSIS — K851 Biliary acute pancreatitis without necrosis or infection: Secondary | ICD-10-CM

## 2014-06-20 LAB — COMPREHENSIVE METABOLIC PANEL
ALT: 46 U/L (ref 0–53)
AST: 17 U/L (ref 0–37)
Albumin: 4.2 g/dL (ref 3.5–5.2)
Alkaline Phosphatase: 98 U/L (ref 39–117)
BUN: 11 mg/dL (ref 6–23)
CALCIUM: 9.5 mg/dL (ref 8.4–10.5)
CO2: 29 meq/L (ref 19–32)
CREATININE: 0.9 mg/dL (ref 0.4–1.5)
Chloride: 104 mEq/L (ref 96–112)
GFR: 99.71 mL/min (ref >60.00)
Glucose, Bld: 86 mg/dL (ref 70–99)
Potassium: 4.1 mEq/L (ref 3.5–5.1)
Sodium: 140 mEq/L (ref 135–145)
Total Bilirubin: 0.5 mg/dL (ref 0.2–1.2)
Total Protein: 7.9 g/dL (ref 6.0–8.3)

## 2014-06-20 LAB — LIPASE: Lipase: 58 U/L (ref 11.0–59.0)

## 2014-06-20 NOTE — Assessment & Plan Note (Addendum)
S/p cholecystectomy and ERCP retrieval of intraductal stone. Recovering well. Recheck lipase and CMP today. Reviewed hospital records as well as discussed slow dietary advance.

## 2014-06-20 NOTE — Assessment & Plan Note (Addendum)
Pt will contact surgery to discuss scheduling repair. Pt stays worried about possible incontinence risk

## 2014-06-20 NOTE — Progress Notes (Signed)
BP 100/68  Pulse 65  Temp(Src) 98.1 F (36.7 C) (Oral)  Wt 160 lb 6.4 oz (72.757 kg)   CC: hosp f/u visit  Subjective:    Patient ID: Marcus [name], male    DOB: Mar 23, 1971, 43 y.o.   MRN: [id number]  HPI: Marcus [name] is a 43 y.o. male presenting on 06/20/2014 for Hospitalization Follow-up   Marcus [name] presents for hospital follow up today. Briefly, hospitalized with gallstone pancreatitis with lipase >3000 and transaminitis in 100s. S/p cholecystectomy and ERCP with sphincterotomy and calculus removal from common bile duct 06/10/2014. Discharged later that day.  Since he's been home, no fever, improving pain after surgery, no further abd pain.  Eating healthier diet. Avoiding meat. Eating fish/chicken and less greasy foods.  Flu shot today.   Asks about scheduling perianal fistula repair as deductible has been met this year.  Admit date: 06/07/2014  Discharge date: 06/10/2014  Primary Care Physician: Ria Bush, MD  Discharge Diagnoses:  . Acute Pancreatitis, gallstone . Asthma  transamnitis  History of duodenal ulcer  Consults: Surgery, Dr Ninfa Linden  GI, Dr Henrene Pastor  Allergies:  No Known Allergies  Brief H and P:  For complete details please refer to admission H and P, but in brief Marcus [name] is a 43 y.o. male, with a past medical history of recurrent abdominal pain due to gallstone, who presents with nausea vomiting and abdominal pain.  The patient reports that he had recurrent abdominal pain due to gallstone. His first episode was 6 months ago when he was working in [place]. Then he had another episode 4 months ago. He was seen by surgeon, Dr. Johney Maine. He is in the process of being scheduled for a surgery, but obviously has not been done yet. 2 days ago, he started having nausea, vomiting and abdominal pain again. He vomited several times without blood in the vomitus. His abdominal pain was located at the epigastric area, 10 out of 10 in severity, constant, stabbing-like pain.  No radiation. It was aggravated by eating food. He took some Percocet at home which gave him some relief. He does not have fever, chills or diarrhea. Denies any symptoms for UTI. He did not notice any blood in his stool or urine.  US-abdomen was done in ED, which showed cholelithiasis without sonographic evidence of acute cholecystitis. His lipase was elevated, at >3000 and had mildly elevated WBC 11.9.  Hospital Course:  Acute Pancreatitis, gallstone: Postop day 1, after laparoscopic cholecystectomy  - Lipase down to 83, general surgery was consulted, patient underwent laparoscopic cholecystectomy on 06/09/14. Cholangiogram showed irregularity and obstruction at the distal common bile duct, possible obstructing stone. Gastroenterology was consulted.  Patient underwent ERCP with sphincterotomy and stone extraction. He is tolerating diet and cleared by surgery and GI for DC home.  Elevated LFTs: Likely due to cholestasis from gallstones, improving today  History of duodenal ulcer : continue oral protonix  Asthma: Currently stable   Relevant past medical, surgical, family and social history reviewed and updated as indicated.  Allergies and medications reviewed and updated. No current outpatient prescriptions on file prior to visit.   No current facility-administered medications on file prior to visit.    Review of Systems Per HPI unless specifically indicated above    Objective:    BP 100/68  Pulse 65  Temp(Src) 98.1 F (36.7 C) (Oral)  Wt 160 lb 6.4 oz (72.757 kg)  Physical Exam  Nursing note and vitals reviewed. Constitutional: He appears well-developed and well-nourished. No  distress.  HENT:  Mouth/Throat: Oropharynx is clear and moist. No oropharyngeal exudate.  Cardiovascular: Normal rate, regular rhythm, normal heart sounds and intact distal pulses.   No murmur heard. Pulmonary/Chest: Effort normal and breath sounds normal. No respiratory distress. He has no wheezes. He has no  rales.  Abdominal: Soft. Normal appearance and bowel sounds are normal. He exhibits no distension and no mass. There is no hepatosplenomegaly. There is no tenderness. There is no rigidity, no rebound, no guarding, no CVA tenderness and negative Murphy's sign.  Healing laparoscopic incisional scars with scabs.  Musculoskeletal: He exhibits no edema.  Skin: Skin is warm and dry. No rash noted.  Psychiatric: He has a normal mood and affect.       Assessment & Plan:   Problem List Items Addressed This Visit   Perianal fistula     Pt will contact surgery to discuss scheduling repair. Pt stays worried about possible incontinence risk    Pancreatitis, gallstone - Primary     S/p cholecystectomy and ERCP retrieval of intraductal stone. Recovering well. Recheck lipase and CMP today. Reviewed hospital records as well as discussed slow dietary advance.    Relevant Medications      pantoprazole (PROTONIX) 40 MG tablet   Other Relevant Orders      Comprehensive metabolic panel      Lipase       Follow up plan: Return if symptoms worsen or fail to improve.

## 2014-06-20 NOTE — Patient Instructions (Addendum)
vacuna de flu hoy (flu shot) haga cita con cirugano para ultima semana de sept or primera de oct. llamenos con preguntas. revisaremos sangre hoy tambien.

## 2014-06-21 ENCOUNTER — Encounter: Payer: Self-pay | Admitting: Family Medicine

## 2014-06-29 NOTE — ED Provider Notes (Signed)
Medical screening examination/treatment/procedure(s) were performed by non-physician practitioner and as supervising physician I was immediately available for consultation/collaboration.   EKG Interpretation None       Charlesetta Shanks, MD 06/29/14 1654

## 2014-07-18 ENCOUNTER — Other Ambulatory Visit (INDEPENDENT_AMBULATORY_CARE_PROVIDER_SITE_OTHER): Payer: Self-pay | Admitting: Surgery

## 2014-07-18 NOTE — H&P (Signed)
Marcus [name] 07/18/2014 8:53 AM Location: [facility] Patient #: [id number] DOB: 15-Oct-1970 Married / Language: Spanish / Race: Refused to Report/Unreported Male  History of Present Illness Marcus [name]; 07/18/2014 9:40 AM) The patient is a 43 year old male presenting status-post cholecystectomy. Patient underwent urgent cholecystectomy for gallstone pancreatitis an ERCP. He is eating well and recovered from that. Denies any pain. No fevers or chills. Daily bowel movements.        Laparoscopic Cholecystectomy with IOC Procedure Note Indications: This patient presents with symptomatic gallbladder disease and will undergo laparoscopic cholecystectomy.  Pre-operative Diagnosis: Calculus of gallbladder without mention of cholecystitis, with obstruction Post-operative Diagnosis: Calculus of gallbladder without mention of cholecystitis, with obstruction Surgeon: [name]      Patient: [name] Seen Collected: 06/09/2014 Client: [facility] Accession: [id number] Received: 06/09/2014 Marcus Keens, [name] DOB: 08/05/1971 Age: 26 Gender: M Reported: 06/10/2014 [address] Patient Ph: [phone] MRN #: [id number] [place], [place] [address] Visit #: [id number] Chart #: Phone:  Fax: CC: Ivor Costa, [name] REPORT OF SURGICAL PATHOLOGY FINAL DIAGNOSIS Diagnosis Gallbladder - CHRONIC CHOLECYSTITIS. - CHOLELITHIASIS. - NO TUMOR SEEN. Marcus [name] Pathologist, Electronic Signature (Case signed 06/10/2014)       06/10/2014: ENDOSCOPIC IMPRESSION: 1. ERCP with sphincterotomy and stone (gravel) extraction 2. Status post cholecystectomy 3. Recent gallstone pancreatitis. Improving. RECOMMENDATIONS: 1. Standard post procedure observation 2. Prophylactic indomethacin suppositories 3. Anticipate discharge home in a.m., or late this afternoon, if doing well. _______________________________ eSigned: [name], [name] 06/10/2014 11:00  AM  Additional reasons for visit:  Anal itching is described as the following: Pleasant male that saw our group March 2015 over concerns of perianal drainage. Concern for perirectal fistula. Recommendation made for examination under anesthesia. Came to me for second opinion April 2015. Also noted classic biliary colic with gallstones. I recommended combined cholecystectomy and examination under anesthesia for possible treatment of fistula. He declined at that time.  He developed gallstone pancreatitis last month. Underwent cholecystectomy and ERCP. He is feeling better now. Having daily bowel movements. No fevers or chills. No constipation or diarrhea. All pain medications. Energy level good.  He does note a dot of pus develops half-inch posterior to his anus. It becomes sore & itches. Usually squeezes it and that helps. Keeps coming back. Concern about fistula. Again he is concerned about the risk of fecal incontinence as he had brought up on the 2 prior visits about this.    Other Problems Marcus [name], [place]; 07/18/2014 8:53 AM) Cholelithiasis Pancreatitis Ulcerative Colitis  Past Surgical History Marcus [name], CMA; 07/18/2014 8:53 AM) Gallbladder Surgery - Laparoscopic  Diagnostic Studies History Marcus [name], CMA; 07/18/2014 8:53 AM) Colonoscopy never  Allergies (Marcus [name], CMA; 07/18/2014 8:53 AM) No Known Drug Allergies10/19/2015  Medication History Marcus [name], CMA; 07/18/2014 8:53 AM) No Current Medications  Social History Marcus [name], [place]; 07/18/2014 8:53 AM) Alcohol use Occasional alcohol use. Caffeine use Coffee. No drug use Tobacco use Never smoker.  Family History Marcus [name], [place]; 07/18/2014 8:53 AM) Diabetes Mellitus Family Members In General.  Review of Systems Marcus [name]; 07/18/2014 9:40 AM) General Not Present- Appetite Loss, Chills, Fatigue, Fever, Night Sweats, Weight Gain and Weight Loss. Skin Not Present- Change  in Wart/Mole, Dryness, Hives, Jaundice, New Lesions, Non-Healing Wounds, Rash and Ulcer. HEENT Not Present- Earache, Hearing Loss, Hoarseness, Nose Bleed, Oral Ulcers, Ringing in the Ears, Seasonal Allergies, Sinus Pain, Sore Throat, Visual Disturbances, Wears glasses/contact lenses and Yellow Eyes. Respiratory  Not Present- Bloody sputum, Chronic Cough, Difficulty Breathing, Snoring and Wheezing. Breast Not Present- Breast Mass, Breast Pain, Nipple Discharge and Skin Changes. Cardiovascular Not Present- Chest Pain, Difficulty Breathing Lying Down, Leg Cramps, Palpitations, Rapid Heart Rate, Shortness of Breath and Swelling of Extremities. Gastrointestinal Not Present- Abdominal Pain, Bloating, Bloody Stool, Change in Bowel Habits, Chronic diarrhea, Constipation, Difficulty Swallowing, Excessive gas, Gets full quickly at meals, Hemorrhoids, Hyperdefecation, Indigestion, Nausea, Rectal Bleeding and Vomiting. Male Genitourinary Not Present- Blood in Urine, Change in Urinary Stream, Frequency, Impotence, Incontinence, Nocturia, Painful Urination, Urethral Discharge, Urgency and Urine Leakage. Musculoskeletal Not Present- Back Pain, Joint Pain, Joint Stiffness, Muscle Pain, Muscle Weakness and Swelling of Extremities. Neurological Not Present- Decreased Memory, Fainting, Headaches, Numbness, Seizures, Tingling, Tremor, Trouble walking and Weakness. Psychiatric Not Present- Anxiety, Bipolar, Change in Sleep Pattern, Depression, Fearful and Frequent crying. Endocrine Not Present- Cold Intolerance, Excessive Hunger, Hair Changes, Heat Intolerance, Hot flashes and New Diabetes. Hematology Not Present- Easy Bruising, Excessive bleeding, Gland problems, HIV and Persistent Infections.   Vitals (Marcus [name] CMA; 07/18/2014 8:54 AM) 07/18/2014 8:53 AM Weight: 160 lb Height: 66in Body Surface Area: 1.84 m Body Mass Index: 25.82 kg/m Temp.: 32F(Temporal)  Pulse: 76 (Regular)  BP: 128/80 (Sitting,  Left Arm, Standard)    Physical Exam Marcus [name]; 07/18/2014 9:38 AM) General Mental Status-Alert. General Appearance-Not in acute distress. Voice-Normal.  Integumentary Global Assessment Upon inspection and palpation of skin surfaces of the - Distribution of scalp and body hair is normal. General Characteristics Overall examination of the patient's skin reveals - no rashes and no suspicious lesions.  Head and Neck Head-normocephalic, atraumatic with no lesions or palpable masses. Face Global Assessment - atraumatic, no absence of expression. Neck Global Assessment - no abnormal movements, no decreased range of motion. Trachea-midline. Thyroid Gland Characteristics - non-tender.  Eye Eyeball - Left-Extraocular movements intact, No Nystagmus. Eyeball - Right-Extraocular movements intact, No Nystagmus. Upper Eyelid - Left-No Cyanotic. Upper Eyelid - Right-No Cyanotic.  Chest and Lung Exam Inspection Accessory muscles - No use of accessory muscles in breathing.  Abdomen Inspection Incisional scars - Cholecystectomy scar and Laparoscopy scars. Note: Abdomen soft and flat with normal healing ridges at such as scars. No abdominal pain. No Murphy sign.  Male Genitourinary Evaluation of genitourinary system reveals-scrotum non-tender, no masses, normal testes, no palpable masses and normal penis. Sexual Maturity Tanner 5 - Adult hair pattern, Adult penile size and shape and Adult testicular size and shape.  Rectal Anorectal Exam External - anorectal fistula, no anal fissures, no external hemorrhoids, no pilonidal cysts, no pilonidal sinuses, no rash noted on exam, no warts . Note: Punctate opening posterior midline. Scarred tract going into the anal canal. Not quite to anal crypt. Feels superficial to the sphincter. No fluctuant mass or abscess. Mild internal hemorrhoids. Not severe. Residue - white. Proctoscopic exam-No Anal Strictures, Internal  hemorrhoids, No Blood, No Pedunculated polyps, No Ulcerations. Note: Fistulous tract felt posterior midline going up to anal crypt. Feels rather superficial  Peripheral Vascular Upper Extremity Inspection - Left - Not Gangrenous, No Petechiae. Right - Not Gangrenous, No Petechiae.  Neurologic Neurologic evaluation reveals -normal attention span and ability to concentrate, able to name objects and repeat phrases. Appropriate fund of knowledge and normal coordination.  Neuropsychiatric Mental status exam performed with findings of-able to articulate well with normal speech/language, rate, volume and coherence and no evidence of hallucinations, delusions, obsessions or homicidal/suicidal ideation. Orientation-oriented X3.  Musculoskeletal Global Assessment Gait and Station - normal  gait and station.  Lymphatic General Lymphatics Description - No Generalized lymphadenopathy.    Assessment & Plan Marcus [name]; 07/18/2014 9:41 AM) Renaldo Harrison PANCREATITIS (577.0   K85.9) Impression: Recovered well from urgent cholecystectomy and ERCP. One month out.  Continue low fat high fiber diet. Continue having daily bowel movements.  At this point, think the risk of surgical complications is low. He can followup as needed for this issue. 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 I again recommend he consider examination under anesthesia to treat the fistula. I am hopeful of the superficial mouth only requires fistulotomy. I did caution him he may require something more vault of his intersphincteric such as an LIFT procedure. Seton unlikely. There is a risk of incontinence, but I think that is rather low in a young healthy male without history of inflammatory disease. I think the risk of no surgery is that this fistula will persist & intermittently get more infected & potentially more complex. He wishes to proceed with surgery. I discussed with him: Current  Plans  Schedule for Surgery [facility] Consent - Anal Abscess / Fistula (Aylan Bayona): discussed with patient and provided information. Pt Education - [facility] Good Bowel Health (Aerianna Losey) Pt Education - [facility] Rectal Surgery HCI (Zanyla Klebba): discussed with patient and provided information. 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 Please see physical examination section)

## 2014-08-10 ENCOUNTER — Other Ambulatory Visit (HOSPITAL_COMMUNITY): Payer: Self-pay | Admitting: *Deleted

## 2014-08-10 NOTE — Patient Instructions (Addendum)
August Kissler  08/10/2014                           YOUR PROCEDURE IS SCHEDULED ON:  08/18/14                ENTER FROM [address] - GO TO PARKING DECK               LOOK FOR [employer]  / [employer]                              FOLLOW  SIGNS TO SHORT STAY CENTER                 ARRIVE AT SHORT STAY AT:  11:30 AM               CALL THIS NUMBER IF ANY PROBLEMS THE DAY OF SURGERY :               [phone]                                REMEMBER:   Do not eat food  AFTER MIDNIGHT              STOP ASPIRIN / HERBAL MEDS 5 DAYS PREOP              MAY HAVE CLEAR LIQUIDS UNTIL 7:30 AM     CLEAR LIQUID DIET   Foods Allowed                                                                     Foods Excluded  Coffee and tea, regular and decaf                             liquids that you cannot  Plain Jell-O in any flavor                                             see through such as: Fruit ices (not with fruit pulp)                                     milk, soups, orange juice  Iced Popsicles                                    All solid food Carbonated beverages, regular and diet                                    Cranberry, grape and apple juices Sports drinks like Gatorade Lightly seasoned clear broth or consume(fat free) Sugar, honey syrup  _____________________________________________________________________  Take these medicines the morning of surgery with               A SIPS OF WATER : none         Do not wear jewelry, make-up   Do not wear lotions, powders, or perfumes.   Do not shave legs or underarms 12 hrs. before surgery (men may shave face)  Do not bring valuables to the hospital.  Contacts, dentures or bridgework may not be worn into surgery.  Leave suitcase in the car. After surgery it may be brought to your room.  For patients admitted to the hospital more than one night, checkout time is            11:00 AM                                                        The day of discharge.   Patients discharged the day of surgery will not be allowed to drive home.            If going home same day of surgery, must have someone stay with you              FIRST 24 hrs at home and arrange for some one to drive you              home from hospital.   ________________________________________________________________________                                                                                                  [place]  Before surgery, you can play an important role.  Because skin is not sterile, your skin needs to be as free of germs as possible.  You can reduce the number of germs on your skin by washing with CHG (chlorahexidine gluconate) soap before surgery.  CHG is an antiseptic cleaner which kills germs and bonds with the skin to continue killing germs even after washing. Please DO NOT use if you have an allergy to CHG or antibacterial soaps.  If your skin becomes reddened/irritated stop using the CHG and inform your nurse when you arrive at Short Stay. Do not shave (including legs and underarms) for at least 48 hours prior to the first CHG shower.  You may shave your face. Please follow these instructions carefully:   1.  Shower with CHG Soap the night before surgery and the  morning of Surgery.   2.  If you choose to wash your hair, wash your hair first as usual with your  normal  Shampoo.   3.  After you shampoo, rinse your hair and body thoroughly to remove the  shampoo.  4.  Use CHG as you would any other liquid soap.  You can apply chg directly  to the skin and wash . Gently wash with scrungie or clean wascloth    5.  Apply the CHG Soap to your body ONLY FROM THE NECK DOWN.   Do not use on open                           Wound or open sores. Avoid contact with eyes, ears mouth and genitals (private parts).                         Genitals (private parts) with your normal soap.              6.  Wash thoroughly, paying special attention to the area where your surgery  will be performed.   7.  Thoroughly rinse your body with warm water from the neck down.   8.  DO NOT shower/wash with your normal soap after using and rinsing off  the CHG Soap .                9.  Pat yourself dry with a clean towel.             10.  Wear clean pajamas.             11.  Place clean sheets on your bed the night of your first shower and do not  sleep with pets.  Day of Surgery : Do not apply any lotions/deodorants the morning of surgery.  Please wear clean clothes to the hospital/surgery center.  FAILURE TO FOLLOW THESE INSTRUCTIONS MAY RESULT IN THE CANCELLATION OF YOUR SURGERY    PATIENT SIGNATURE_________________________________  ______________________________________________________________________

## 2014-08-11 ENCOUNTER — Encounter (HOSPITAL_COMMUNITY): Payer: Self-pay

## 2014-08-11 ENCOUNTER — Encounter (HOSPITAL_COMMUNITY)
Admission: RE | Admit: 2014-08-11 | Discharge: 2014-08-11 | Disposition: A | Discharge status: Home / Self Care | Payer: 59 | Source: Ambulatory Visit | Attending: Surgery | Admitting: Surgery

## 2014-08-11 DIAGNOSIS — Z01812 Encounter for preprocedural laboratory examination: Secondary | ICD-10-CM | POA: Insufficient documentation

## 2014-08-11 HISTORY — DX: Rectal fistula, unspecified: K60.40

## 2014-08-11 HISTORY — DX: Rectal fistula: K60.4

## 2014-08-11 LAB — CBC
HCT: 45.5 % (ref 39.0–52.0)
Hemoglobin: 15.6 g/dL (ref 13.0–17.0)
MCH: 30.8 pg (ref 26.0–34.0)
MCHC: 34.3 g/dL (ref 30.0–36.0)
MCV: 89.7 fL (ref 78.0–100.0)
PLATELETS: 262 10*3/uL (ref 150–400)
RBC: 5.07 MIL/uL (ref 4.22–5.81)
RDW: 12.3 % (ref 11.5–15.5)
WBC: 5.2 10*3/uL (ref 4.0–10.5)

## 2014-08-18 ENCOUNTER — Encounter (HOSPITAL_COMMUNITY): Payer: Self-pay | Admitting: *Deleted

## 2014-08-18 ENCOUNTER — Ambulatory Visit (HOSPITAL_COMMUNITY)
Admission: RE | Admit: 2014-08-18 | Discharge: 2014-08-18 | Disposition: A | Discharge status: Home / Self Care | Payer: 59 | Source: Ambulatory Visit | Attending: Surgery | Admitting: Surgery

## 2014-08-18 ENCOUNTER — Ambulatory Visit (HOSPITAL_COMMUNITY): Payer: 59 | Admitting: Certified Registered Nurse Anesthetist

## 2014-08-18 ENCOUNTER — Encounter (HOSPITAL_COMMUNITY)
Admission: RE | Disposition: A | Discharge status: Home / Self Care | Payer: Self-pay | Source: Ambulatory Visit | Attending: Surgery

## 2014-08-18 DIAGNOSIS — K5190 Ulcerative colitis, unspecified, without complications: Secondary | ICD-10-CM | POA: Diagnosis not present

## 2014-08-18 DIAGNOSIS — K641 Second degree hemorrhoids: Secondary | ICD-10-CM | POA: Insufficient documentation

## 2014-08-18 DIAGNOSIS — K604 Rectal fistula: Secondary | ICD-10-CM | POA: Diagnosis not present

## 2014-08-18 HISTORY — PX: EVALUATION UNDER ANESTHESIA WITH FISTULECTOMY: SHX5623

## 2014-08-18 SURGERY — EXAM UNDER ANESTHESIA WITH FISTULECTOMY
Anesthesia: General

## 2014-08-18 MED ORDER — ALUM & MAG HYDROXIDE-SIMETH 200-200-20 MG/5ML PO SUSP
30.0000 mL | Freq: Four times a day (QID) | ORAL | Status: DC | PRN
  Filled 2014-08-18: qty 30

## 2014-08-18 MED ORDER — PROPOFOL 10 MG/ML IV BOLUS
INTRAVENOUS | Status: AC
  Filled 2014-08-18: qty 20

## 2014-08-18 MED ORDER — BUPIVACAINE LIPOSOME 1.3 % IJ SUSP
20.0000 mL | INTRAMUSCULAR | Status: DC
  Filled 2014-08-18: qty 20

## 2014-08-18 MED ORDER — BUPIVACAINE LIPOSOME 1.3 % IJ SUSP
INTRAMUSCULAR | Status: DC | PRN
  Administered 2014-08-18: 20 mL

## 2014-08-18 MED ORDER — SODIUM CHLORIDE 0.9 % IJ SOLN: 3.0000 mL | Freq: Two times a day (BID) | INTRAMUSCULAR | Status: DC

## 2014-08-18 MED ORDER — METRONIDAZOLE IN NACL 5-0.79 MG/ML-% IV SOLN
500.0000 mg | INTRAVENOUS | Status: AC
  Administered 2014-08-18: .5 g via INTRAVENOUS

## 2014-08-18 MED ORDER — NAPROXEN 500 MG PO TABS
500.0000 mg | ORAL_TABLET | Freq: Two times a day (BID) | ORAL | Status: DC | PRN
  Filled 2014-08-18: qty 1

## 2014-08-18 MED ORDER — KETOROLAC TROMETHAMINE 30 MG/ML IJ SOLN
INTRAMUSCULAR | Status: DC | PRN
  Administered 2014-08-18: 30 mg via INTRAVENOUS

## 2014-08-18 MED ORDER — OXYCODONE HCL 5 MG PO TABS: 5.0000 mg | ORAL_TABLET | ORAL | Status: DC | PRN

## 2014-08-18 MED ORDER — METRONIDAZOLE IN NACL 5-0.79 MG/ML-% IV SOLN
INTRAVENOUS | Status: AC
  Filled 2014-08-18: qty 100

## 2014-08-18 MED ORDER — PROMETHAZINE HCL 25 MG/ML IJ SOLN: 6.2500 mg | INTRAMUSCULAR | Status: DC | PRN

## 2014-08-18 MED ORDER — MIDAZOLAM HCL 2 MG/2ML IJ SOLN
INTRAMUSCULAR | Status: AC
  Filled 2014-08-18: qty 2

## 2014-08-18 MED ORDER — FENTANYL CITRATE 0.05 MG/ML IJ SOLN: 25.0000 ug | INTRAMUSCULAR | Status: DC | PRN

## 2014-08-18 MED ORDER — SODIUM CHLORIDE 0.9 % IJ SOLN
INTRAMUSCULAR | Status: AC
  Filled 2014-08-18: qty 10

## 2014-08-18 MED ORDER — BUPIVACAINE-EPINEPHRINE 0.25% -1:200000 IJ SOLN
INTRAMUSCULAR | Status: AC
  Filled 2014-08-18: qty 1

## 2014-08-18 MED ORDER — FENTANYL CITRATE 0.05 MG/ML IJ SOLN
INTRAMUSCULAR | Status: AC
  Filled 2014-08-18: qty 5

## 2014-08-18 MED ORDER — CHLORHEXIDINE GLUCONATE 4 % EX LIQD: 1.0000 "application " | Freq: Once | CUTANEOUS | Status: DC

## 2014-08-18 MED ORDER — FENTANYL CITRATE 0.05 MG/ML IJ SOLN
INTRAMUSCULAR | Status: DC | PRN
  Administered 2014-08-18 (×5): 50 ug via INTRAVENOUS

## 2014-08-18 MED ORDER — PROPOFOL 10 MG/ML IV BOLUS
INTRAVENOUS | Status: DC | PRN
  Administered 2014-08-18: 200 mg via INTRAVENOUS

## 2014-08-18 MED ORDER — CEFAZOLIN SODIUM-DEXTROSE 2-3 GM-% IV SOLR
2.0000 g | INTRAVENOUS | Status: AC
  Administered 2014-08-18: 2 g via INTRAVENOUS

## 2014-08-18 MED ORDER — ONDANSETRON HCL 4 MG/2ML IJ SOLN
INTRAMUSCULAR | Status: DC | PRN
  Administered 2014-08-18: 4 mg via INTRAVENOUS

## 2014-08-18 MED ORDER — ONDANSETRON HCL 4 MG/2ML IJ SOLN: 4.0000 mg | Freq: Four times a day (QID) | INTRAMUSCULAR | Status: DC | PRN

## 2014-08-18 MED ORDER — DIPHENHYDRAMINE HCL 50 MG/ML IJ SOLN: 12.5000 mg | Freq: Four times a day (QID) | INTRAMUSCULAR | Status: DC | PRN

## 2014-08-18 MED ORDER — LIP MEDEX EX OINT: 1.0000 "application " | TOPICAL_OINTMENT | Freq: Two times a day (BID) | CUTANEOUS | Status: DC

## 2014-08-18 MED ORDER — ONDANSETRON HCL 4 MG/2ML IJ SOLN
INTRAMUSCULAR | Status: AC
  Filled 2014-08-18: qty 2

## 2014-08-18 MED ORDER — SODIUM CHLORIDE 0.9 % IJ SOLN
3.0000 mL | INTRAMUSCULAR | Status: DC | PRN
Start: 2014-08-18 — End: 2014-08-18

## 2014-08-18 MED ORDER — DICLOFENAC SODIUM 75 MG PO TBEC: 75.0000 mg | DELAYED_RELEASE_TABLET | Freq: Three times a day (TID) | ORAL | Status: DC | PRN

## 2014-08-18 MED ORDER — ONDANSETRON 8 MG/NS 50 ML IVPB
8.0000 mg | Freq: Four times a day (QID) | INTRAVENOUS | Status: DC | PRN
  Filled 2014-08-18: qty 8

## 2014-08-18 MED ORDER — KETOROLAC TROMETHAMINE 30 MG/ML IJ SOLN
15.0000 mg | Freq: Once | INTRAMUSCULAR | Status: AC | PRN
  Administered 2014-08-18: 30 mg via INTRAVENOUS
  Filled 2014-08-18: qty 1

## 2014-08-18 MED ORDER — ACETAMINOPHEN 325 MG PO TABS: 325.0000 mg | ORAL_TABLET | Freq: Four times a day (QID) | ORAL | Status: DC | PRN

## 2014-08-18 MED ORDER — LIDOCAINE HCL (CARDIAC) 20 MG/ML IV SOLN
INTRAVENOUS | Status: AC
  Filled 2014-08-18: qty 5

## 2014-08-18 MED ORDER — SODIUM CHLORIDE 0.9 % IJ SOLN
INTRAMUSCULAR | Status: DC | PRN
  Administered 2014-08-18: 10 mL

## 2014-08-18 MED ORDER — SODIUM CHLORIDE 0.9 % IV SOLN: 250.0000 mL | INTRAVENOUS | Status: DC | PRN

## 2014-08-18 MED ORDER — MAGIC MOUTHWASH
15.0000 mL | Freq: Four times a day (QID) | ORAL | Status: DC | PRN
Start: 2014-08-18 — End: 2014-08-18
  Filled 2014-08-18: qty 15

## 2014-08-18 MED ORDER — MENTHOL 3 MG MT LOZG: 1.0000 | LOZENGE | OROMUCOSAL | Status: DC | PRN

## 2014-08-18 MED ORDER — HYDROMORPHONE HCL 1 MG/ML IJ SOLN: 0.5000 mg | INTRAMUSCULAR | Status: DC | PRN

## 2014-08-18 MED ORDER — LACTATED RINGERS IV SOLN
INTRAVENOUS | Status: DC
  Administered 2014-08-18: 13:00:00 via INTRAVENOUS
  Administered 2014-08-18: 1000 mL via INTRAVENOUS

## 2014-08-18 MED ORDER — CEFAZOLIN SODIUM-DEXTROSE 2-3 GM-% IV SOLR
INTRAVENOUS | Status: AC
  Filled 2014-08-18: qty 50

## 2014-08-18 MED ORDER — ROCURONIUM BROMIDE 100 MG/10ML IV SOLN
INTRAVENOUS | Status: AC
  Filled 2014-08-18: qty 1

## 2014-08-18 MED ORDER — LIDOCAINE HCL (CARDIAC) 20 MG/ML IV SOLN
INTRAVENOUS | Status: DC | PRN
  Administered 2014-08-18: 100 mg via INTRAVENOUS

## 2014-08-18 MED ORDER — PHENOL 1.4 % MT LIQD: 2.0000 | OROMUCOSAL | Status: DC | PRN

## 2014-08-18 MED ORDER — HYDROGEN PEROXIDE 3 % EX SOLN
CUTANEOUS | Status: AC
  Filled 2014-08-18: qty 473

## 2014-08-18 MED ORDER — 0.9 % SODIUM CHLORIDE (POUR BTL) OPTIME
TOPICAL | Status: DC | PRN
  Administered 2014-08-18: 1000 mL

## 2014-08-18 MED ORDER — LACTATED RINGERS IV BOLUS (SEPSIS): 1000.0000 mL | Freq: Three times a day (TID) | INTRAVENOUS | Status: DC | PRN

## 2014-08-18 MED ORDER — MIDAZOLAM HCL 5 MG/5ML IJ SOLN
INTRAMUSCULAR | Status: DC | PRN
  Administered 2014-08-18: 2 mg via INTRAVENOUS

## 2014-08-18 MED ORDER — DEXAMETHASONE SODIUM PHOSPHATE 10 MG/ML IJ SOLN
INTRAMUSCULAR | Status: DC | PRN
  Administered 2014-08-18: 10 mg via INTRAVENOUS

## 2014-08-18 MED ORDER — METHYLENE BLUE 1 % INJ SOLN
INTRAMUSCULAR | Status: AC
  Filled 2014-08-18: qty 10

## 2014-08-18 SURGICAL SUPPLY — 30 items
BLADE HEX COATED 2.75 (ELECTRODE) ×3 IMPLANT
BLADE SURG 15 STRL LF DISP TIS (BLADE) IMPLANT
BLADE SURG 15 STRL SS (BLADE)
CANISTER SUCTION 2500CC (MISCELLANEOUS) IMPLANT
DECANTER SPIKE VIAL GLASS SM (MISCELLANEOUS) ×6 IMPLANT
DRSG PAD ABDOMINAL 8X10 ST (GAUZE/BANDAGES/DRESSINGS) IMPLANT
ELECT REM PT RETURN 9FT ADLT (ELECTROSURGICAL) ×3
ELECTRODE REM PT RTRN 9FT ADLT (ELECTROSURGICAL) ×1 IMPLANT
GAUZE PACKING IODOFORM 1/4X15 (GAUZE/BANDAGES/DRESSINGS) ×3 IMPLANT
GAUZE SPONGE 4X4 12PLY STRL (GAUZE/BANDAGES/DRESSINGS) ×3 IMPLANT
GAUZE SPONGE 4X4 16PLY XRAY LF (GAUZE/BANDAGES/DRESSINGS) ×3 IMPLANT
GLOVE ECLIPSE 8.0 STRL XLNG CF (GLOVE) ×3 IMPLANT
GLOVE INDICATOR 8.0 STRL GRN (GLOVE) ×3 IMPLANT
GOWN STRL REUS W/TWL XL LVL3 (GOWN DISPOSABLE) ×6 IMPLANT
KIT BASIN OR (CUSTOM PROCEDURE TRAY) ×3 IMPLANT
LUBRICANT JELLY K Y 4OZ (MISCELLANEOUS) ×3 IMPLANT
NEEDLE HYPO 22GX1.5 SAFETY (NEEDLE) ×3 IMPLANT
NS IRRIG 1000ML POUR BTL (IV SOLUTION) ×3 IMPLANT
PACK LITHOTOMY IV (CUSTOM PROCEDURE TRAY) ×3 IMPLANT
PENCIL BUTTON HOLSTER BLD 10FT (ELECTRODE) ×3 IMPLANT
SUT CHROMIC 2 0 SH (SUTURE) IMPLANT
SUT CHROMIC 3 0 SH 27 (SUTURE) ×3 IMPLANT
SUT PROLENE 2 0 SH DA (SUTURE) IMPLANT
SUT VIC AB 2-0 UR6 27 (SUTURE) ×6 IMPLANT
SUT VIC AB 3-0 SH 27 (SUTURE)
SUT VIC AB 3-0 SH 27XBRD (SUTURE) IMPLANT
SYR 20CC LL (SYRINGE) ×6 IMPLANT
TOWEL OR 17X26 10 PK STRL BLUE (TOWEL DISPOSABLE) ×3 IMPLANT
TOWEL OR NON WOVEN STRL DISP B (DISPOSABLE) ×3 IMPLANT
YANKAUER SUCT BULB TIP 10FT TU (MISCELLANEOUS) ×3 IMPLANT

## 2014-08-18 NOTE — Transfer of Care (Signed)
Immediate Anesthesia Transfer of Care Note  Patient: Marcus [name]  Procedure(s) Performed: Procedure(s) (LRB): internal hemorrhoidectomy, ligation of intershpinteric fistula tract (N/A)  Patient Location: PACU  Anesthesia Type: General  Level of Consciousness: sedated, patient cooperative and responds to stimulation  Airway & Oxygen Therapy: Patient Spontanous Breathing and Patient connected to face mask oxgen  Post-op Assessment: Report given to PACU RN and Post -op Vital signs reviewed and stable  Post vital signs: Reviewed and stable  Complications: No apparent anesthesia complications

## 2014-08-18 NOTE — Anesthesia Preprocedure Evaluation (Signed)
Anesthesia Evaluation  Patient identified by MRN, date of birth, ID band Patient awake    Reviewed: Allergy & Precautions, H&P , NPO status , Patient's Chart, lab work & pertinent test results  Airway Mallampati: III  TM Distance: <3 FB Neck ROM: Full    Dental no notable dental hx.    Pulmonary neg pulmonary ROS,  breath sounds clear to auscultation  Pulmonary exam normal       Cardiovascular negative cardio ROS  Rhythm:Regular Rate:Normal     Neuro/Psych negative neurological ROS  negative psych ROS   GI/Hepatic negative GI ROS, Neg liver ROS,   Endo/Other  negative endocrine ROS  Renal/GU negative Renal ROS  negative genitourinary   Musculoskeletal negative musculoskeletal ROS (+)   Abdominal   Peds negative pediatric ROS (+)  Hematology negative hematology ROS (+)   Anesthesia Other Findings   Reproductive/Obstetrics negative OB ROS                             Anesthesia Physical Anesthesia Plan  ASA: I  Anesthesia Plan: General   Post-op Pain Management:    Induction: Intravenous  Airway Management Planned: Oral ETT  Additional Equipment:   Intra-op Plan:   Post-operative Plan:   Informed Consent: I have reviewed the patients History and Physical, chart, labs and discussed the procedure including the risks, benefits and alternatives for the proposed anesthesia with the patient or authorized representative who has indicated his/her understanding and acceptance.   Dental advisory given  Plan Discussed with: CRNA and Surgeon  Anesthesia Plan Comments:         Anesthesia Quick Evaluation

## 2014-08-18 NOTE — H&P (Signed)
Marcus [name] 07/18/2014 8:53 AM Location: [facility] Patient #: [id number] DOB: [birth year] Married / Language: Spanish / Race: Refused to Report/Unreported Male  History of Present Illness [name]; 07/18/2014 9:40 AM) The patient is a 43 year old male presenting status-post cholecystectomy. Patient underwent urgent cholecystectomy for gallstone pancreatitis an ERCP. He is eating well and recovered from that. Denies any pain. No fevers or chills. Daily bowel movements.   Laparoscopic Cholecystectomy with IOC Procedure Note Indications: This patient presents with symptomatic gallbladder disease and will undergo laparoscopic cholecystectomy.  Pre-operative Diagnosis: Calculus of gallbladder without mention of cholecystitis, with obstruction Post-operative Diagnosis: Calculus of gallbladder without mention of cholecystitis, with obstruction Surgeon: [name]   Patient: Marcus [name] Seen Collected: 06/09/2014 Client: [facility] Accession: [id number] Received: 06/09/2014 Marcus Keens, MD DOB: 01-03-1971 Age: 79 Gender: M Reported: 06/10/2014 [address] Patient Ph: [phone] MRN #: [id number] [place], [place] [address] Visit #: [id number] Chart #: Phone:  Fax: CC: Ivor Costa, MD REPORT OF SURGICAL PATHOLOGY FINAL DIAGNOSIS Diagnosis Gallbladder - CHRONIC CHOLECYSTITIS. - CHOLELITHIASIS. - NO TUMOR SEEN. [name] MD Pathologist, Electronic Signature (Case signed 06/10/2014)       06/10/2014: ENDOSCOPIC IMPRESSION: 1. ERCP with sphincterotomy and stone (gravel) extraction 2. Status post cholecystectomy 3. Recent gallstone pancreatitis. Improving. RECOMMENDATIONS: 1. Standard post procedure observation 2. Prophylactic indomethacin suppositories 3. Anticipate discharge home in a.m., or late this afternoon, if doing well. _______________________________ eSigned: [name], MD 06/10/2014 11:00 AM  Additional  reasons for visit:  Anal itching is described as the following:  Pleasant male that saw our group March 2015 over concerns of perianal drainage. Concern for perirectal fistula. Recommendation made for examination under anesthesia. Came to me for second opinion April 2015. Also noted classic biliary colic with gallstones. I recommended combined cholecystectomy and examination under anesthesia for possible treatment of fistula. He declined at that time.  He developed gallstone pancreatitis 06/08/2014. Underwent cholecystectomy and ERCP. He is feeling better now. Having daily bowel movements. No fevers or chills. No constipation or diarrhea. All pain medications. Energy level good.  He still has anal drainage. Usuallu posterior to his anus. It becomes sore & itches. Usually squeezes it and that helps. Keeps coming back. Concern about fistula. Again he is concerned about the risk of fecal incontinence as he had brought up on the 2 prior visits about this.    Other Problems Marcus [name], [place]; 07/18/2014 8:53 AM) Cholelithiasis Pancreatitis Ulcerative Colitis  Past Surgical History Marcus [name], CMA; 07/18/2014 8:53 AM) Gallbladder Surgery - Laparoscopic  Diagnostic Studies History Marcus [name], CMA; 07/18/2014 8:53 AM) Colonoscopy never  Allergies (Marcus [name], CMA; 07/18/2014 8:53 AM) No Known Drug Allergies10/19/2015  Medication History Marcus [name], CMA; 07/18/2014 8:53 AM) No Current Medications  Social History Marcus [name], [place]; 07/18/2014 8:53 AM) Alcohol use Occasional alcohol use. Caffeine use Coffee. No drug use Tobacco use Never smoker.  Family History Marcus [name], [place]; 07/18/2014 8:53 AM) Diabetes Mellitus Family Members In General.  Review of Systems [name]; 07/18/2014 9:40 AM) General Not Present- Appetite Loss, Chills, Fatigue, Fever, Night Sweats, Weight Gain and Weight Loss. Skin Not Present- Change in Wart/Mole, Dryness,  Hives, Jaundice, New Lesions, Non-Healing Wounds, Rash and Ulcer. HEENT Not Present- Earache, Hearing Loss, Hoarseness, Nose Bleed, Oral Ulcers, Ringing in the Ears, Seasonal Allergies, Sinus Pain, Sore Throat, Visual Disturbances, Wears glasses/contact lenses and Yellow Eyes. Respiratory Not Present- Bloody sputum, Chronic Cough, Difficulty Breathing, Snoring and Wheezing.  Breast Not Present- Breast Mass, Breast Pain, Nipple Discharge and Skin Changes. Cardiovascular Not Present- Chest Pain, Difficulty Breathing Lying Down, Leg Cramps, Palpitations, Rapid Heart Rate, Shortness of Breath and Swelling of Extremities. Gastrointestinal Not Present- Abdominal Pain, Bloating, Bloody Stool, Change in Bowel Habits, Chronic diarrhea, Constipation, Difficulty Swallowing, Excessive gas, Gets full quickly at meals, Hemorrhoids, Hyperdefecation, Indigestion, Nausea, Rectal Bleeding and Vomiting. Male Genitourinary Not Present- Blood in Urine, Change in Urinary Stream, Frequency, Impotence, Incontinence, Nocturia, Painful Urination, Urethral Discharge, Urgency and Urine Leakage. Musculoskeletal Not Present- Back Pain, Joint Pain, Joint Stiffness, Muscle Pain, Muscle Weakness and Swelling of Extremities. Neurological Not Present- Decreased Memory, Fainting, Headaches, Numbness, Seizures, Tingling, Tremor, Trouble walking and Weakness. Psychiatric Not Present- Anxiety, Bipolar, Change in Sleep Pattern, Depression, Fearful and Frequent crying. Endocrine Not Present- Cold Intolerance, Excessive Hunger, Hair Changes, Heat Intolerance, Hot flashes and New Diabetes. Hematology Not Present- Easy Bruising, Excessive bleeding, Gland problems, HIV and Persistent Infections.   Vitals (Marcus [name] CMA; 07/18/2014 8:54 AM) 07/18/2014 8:53 AM Weight: 160 lb Height: 66in Body Surface Area: 1.84 m Body Mass Index: 25.82 kg/m Temp.: 62F(Temporal)  Pulse: 76 (Regular)  BP: 128/80 (Sitting, Left Arm,  Standard)    Physical Exam [name]; 07/18/2014 9:38 AM) General Mental Status-Alert. General Appearance-Not in acute distress. Voice-Normal.  Integumentary Global Assessment Upon inspection and palpation of skin surfaces of the - Distribution of scalp and body hair is normal. General Characteristics Overall examination of the patient's skin reveals - no rashes and no suspicious lesions.  Head and Neck Head-normocephalic, atraumatic with no lesions or palpable masses. Face Global Assessment - atraumatic, no absence of expression. Neck Global Assessment - no abnormal movements, no decreased range of motion. Trachea-midline. Thyroid Gland Characteristics - non-tender.  Eye Eyeball - Left-Extraocular movements intact, No Nystagmus. Eyeball - Right-Extraocular movements intact, No Nystagmus. Upper Eyelid - Left-No Cyanotic. Upper Eyelid - Right-No Cyanotic.  Chest and Lung Exam Inspection Accessory muscles - No use of accessory muscles in breathing.  Abdomen Inspection Incisional scars - Cholecystectomy scar and Laparoscopy scars. Note: Abdomen soft and flat with normal healing ridges at such as scars. No abdominal pain. No Murphy sign.  Male Genitourinary Evaluation of genitourinary system reveals-scrotum non-tender, no masses, normal testes, no palpable masses and normal penis. Sexual Maturity Tanner 5 - Adult hair pattern, Adult penile size and shape and Adult testicular size and shape.  Rectal Anorectal Exam External - anorectal fistula, no anal fissures, no external hemorrhoids, no pilonidal cysts, no pilonidal sinuses, no rash noted on exam, no warts . Note: Punctate opening posterior midline. Scarred tract going into the anal canal. Not quite to anal crypt. Feels superficial to the sphincter. No fluctuant mass or abscess. Mild internal hemorrhoids. Not severe. Residue - white. Proctoscopic exam-No Anal Strictures, Internal  hemorrhoids, No Blood, No Pedunculated polyps, No Ulcerations. Note: Fistulous tract felt posterior midline going up to anal crypt. Feels rather superficial  Peripheral Vascular Upper Extremity Inspection - Left - Not Gangrenous, No Petechiae. Right - Not Gangrenous, No Petechiae.  Neurologic Neurologic evaluation reveals -normal attention span and ability to concentrate, able to name objects and repeat phrases. Appropriate fund of knowledge and normal coordination.  Neuropsychiatric Mental status exam performed with findings of-able to articulate well with normal speech/language, rate, volume and coherence and no evidence of hallucinations, delusions, obsessions or homicidal/suicidal ideation. Orientation-oriented X3.  Musculoskeletal Global Assessment Gait and Station - normal gait and station.  Lymphatic General Lymphatics Description - No Generalized  lymphadenopathy.    Assessment & Plan [name]; 07/18/2014 9:41 AM) Renaldo Harrison PANCREATITIS (577.0   K85.9) Impression: Recovered well from urgent cholecystectomy and ERCP. One month out.  Continue low fat high fiber diet. Continue having daily bowel movements.  At this point, think the risk of surgical complications is low. He can followup as needed for this issue. 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 I again recommend he consider examination under anesthesia to treat the fistula. I am hopeful of the superficial mouth only requires fistulotomy. I did caution him he may require something more involved if it is intersphincteric such as an LIFT procedure. Seton unlikely. There is a risk of incontinence, but I think that is rather low in a young healthy male without history of inflammatory disease. I think the risk of no surgery is that this fistula will persist & intermittently get more infected & potentially more complex. He wishes to proceed with surgery. I discussed with him:  The  anatomy & physiology of the anorectal region was discussed.  We discussed the pathophysiology of anorectal abscess and fistula.  Differential diagnosis was discussed.  Natural history progression was discussed.   I stressed the importance of a bowel regimen to have daily soft bowel movements to minimize progression of disease.     The patient's condition is not adequately controlled.  Non-operative treatment has not healed the fistula.  Therefore, I recommended examination under anaesthesia to confirm the diagnosis and treat the fistula.  I discussed techniques that may be required such as fistulotomy, ligation by LIFT technique, and/or seton placement.  Benefits & alternatives discussed.  I noted a good likelihood this will help address the problem, but sometimes repeat operations and prolonged healing times may occur.  Risks such as bleeding, pain, recurrence, reoperation, incontinence, heart attack, death, and other risks were discussed.      Educational handouts further explaining the pathology, treatment options, and bowel regimen were given.  The patient expressed understanding & wishes to proceed.  We will work to coordinate surgery for a mutually convenient time.      Current Plans  Schedule for Surgery [facility] Consent - Anal Abscess / Fistula (Addisen Chappelle): discussed with patient and provided information. Pt Education - [facility] Good Bowel Health (Ascencion Stegner) Pt Education - [facility] Rectal Surgery HCI (Arhan Mcmanamon): discussed with patient and provided information. 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 Please see physical examination section)   Signed by [name], MD (07/18/2014 9:41 AM)

## 2014-08-18 NOTE — Anesthesia Postprocedure Evaluation (Signed)
  Anesthesia Post-op Note  Patient: Marcus [name]  Procedure(s) Performed: Procedure(s) (LRB): internal hemorrhoidectomy, ligation of intershpinteric fistula tract (N/A)  Patient Location: PACU  Anesthesia Type: General  Level of Consciousness: awake and alert   Airway and Oxygen Therapy: Patient Spontanous Breathing  Post-op Pain: mild  Post-op Assessment: Post-op Vital signs reviewed, Patient's Cardiovascular Status Stable, Respiratory Function Stable, Patent Airway and No signs of Nausea or vomiting  Last Vitals:  Filed Vitals:   08/18/14 1425  BP: 118/64  Pulse: 92  Temp: 36.4 C  Resp: 18    Post-op Vital Signs: stable   Complications: No apparent anesthesia complications

## 2014-08-18 NOTE — Discharge Instructions (Signed)
ANORECTAL SURGERY:  POST OPERATIVE INSTRUCTIONS  1. Take your usually prescribed home medications unless otherwise directed. 2. DIET: Follow a light bland diet the first 24 hours after arrival home, such as soup, liquids, crackers, etc.  Be sure to include lots of fluids daily.  Avoid fast food or heavy meals as your are more likely to get nauseated.  Eat a low fat the next few days after surgery.   3. PAIN CONTROL: a. Pain is best controlled by a usual combination of three different methods TOGETHER: i. Ice/Heat ii. Over the counter pain medication iii. Prescription pain medication b. Most patients will experience some swelling and discomfort in the anus/rectal area. and incisions.  Ice packs or heat (30-60 minutes up to 6 times a day) will help. Use ice for the first few days to help decrease swelling and bruising, then switch to heat such as warm towels, sitz baths, warm baths, etc to help relax tight/sore spots and speed recovery.  Some people prefer to use ice alone, heat alone, alternating between ice & heat.  Experiment to what works for you.  Swelling and bruising can take several weeks to resolve.   c. It is helpful to take an over-the-counter pain medication regularly for the first few weeks.  Choose one of the following that works best for you: i. Naproxen (Aleve, etc)  Two 220mg  tabs twice a day ii. Ibuprofen (Advil, etc) Three 200mg  tabs four times a day (every meal & bedtime) iii. Acetaminophen (Tylenol, etc) 500-650mg  four times a day (every meal & bedtime) d. A  prescription for pain medication (such as oxycodone, hydrocodone, etc) should be given to you upon discharge.  Take your pain medication as prescribed.  i. If you are having problems/concerns with the prescription medicine (does not control pain, nausea, vomiting, rash, itching, etc), please call us [phone] to see if we need to switch you to a different pain medicine that will work better for you and/or control your  side effect better. ii. If you need a refill on your pain medication, please contact your pharmacy.  They will contact our office to request authorization. Prescriptions will not be filled after 5 pm or on week-ends.  Use a Sitz Bath 4-8 times a day for relief A sitz bath is a warm water bath taken in the sitting position that covers only the hips and buttocks. It may be used for either healing or hygiene purposes. Sitz baths are also used to relieve pain, itching, or muscle spasms. The water may contain medicine. Moist heat will help you heal and relax.  HOME CARE INSTRUCTIONS  Take 3 to 4 sitz baths a day.  Fill the bathtub half full with warm water.  Sit in the water and open the drain a little.  Turn on the warm water to keep the tub half full. Keep the water running constantly.  Soak in the water for 15 to 20 minutes.  After the sitz bath, pat the affected area dry first. SEEK MEDICAL CARE IF:  You get worse instead of better. Stop the sitz baths if you get worse.   4. KEEP YOUR BOWELS REGULAR a. The goal is one bowel movement a day b. Avoid getting constipated.  Between the surgery and the pain medications, it is common to experience some constipation.  Increasing fluid intake and taking a fiber supplement (such as Metamucil, Citrucel, FiberCon, MiraLax, etc) 1-2 times a day regularly will usually help prevent this problem from occurring.  A  mild laxative (prune juice, Milk of Magnesia, MiraLax, etc) should be taken according to package directions if there are no bowel movements after 48 hours. c. Watch out for diarrhea.  If you have many loose bowel movements, simplify your diet to bland foods & liquids for a few days.  Stop any stool softeners and decrease your fiber supplement.  Switching to mild anti-diarrheal medications (Kayopectate, Pepto Bismol) can help.  If this worsens or does not improve, please call us.  5. Wound Care a. Remove your bandages the day after surgery.   Unless discharge instructions indicate otherwise, leave your bandage dry and in place overnight.  Remove the bandage during your first bowel movement.   b. Allow the wound packing to fall out over the next few days.  You can trim exposed gauze / ribbon as it falls out.  You do not need to repack the wound unless instructed otherwise.  Wear an absorbent pad or soft cotton gauze in your underwear as needed to catch any drainage and help keep the area  c. Keep the area clean and dry.  Bathe / shower every day.  Keep the area clean by showering / bathing over the incision / wound.   It is okay to soak an open wound to help wash it.  Wet wipes or showers / gentle washing after bowel movements is often less traumatic than regular toilet paper. d. Dennis Bast may have some styrofoam-like soft packing in the rectum which will come out with the first bowel movement.  e. You will often notice bleeding with bowel movements.  This should slow down by the end of the first week of surgery f. Expect some drainage.  This should slow down, too, by the end of the first week of surgery.  Wear an absorbent pad or soft cotton gauze in your underwear until the drainage stops. 6. ACTIVITIES as tolerated:   a. You may resume regular (light) daily activities beginning the next day--such as daily self-care, walking, climbing stairs--gradually increasing activities as tolerated.  If you can walk 30 minutes without difficulty, it is safe to try more intense activity such as jogging, treadmill, bicycling, low-impact aerobics, swimming, etc. b. Save the most intensive and strenuous activity for last such as sit-ups, heavy lifting, contact sports, etc  Refrain from any heavy lifting or straining until you are off narcotics for pain control.   c. DO NOT PUSH THROUGH PAIN.  Let pain be your guide: If it hurts to do something, don't do it.  Pain is your body warning you to avoid that activity for another week until the pain goes down. d. You may  drive when you are no longer taking prescription pain medication, you can comfortably sit for long periods of time, and you can safely maneuver your car and apply brakes. e. Dennis Bast may have sexual intercourse when it is comfortable.  7. FOLLOW UP in our office a. Please call [facility] at [phone] to set up an appointment to see your surgeon in the office for a follow-up appointment approximately 2 weeks after your surgery. b. Make sure that you call for this appointment the day you arrive home to insure a convenient appointment time. 10. IF YOU HAVE DISABILITY OR FAMILY LEAVE FORMS, BRING THEM TO THE OFFICE FOR PROCESSING.  DO NOT GIVE THEM TO YOUR DOCTOR.        WHEN TO CALL us [phone]: 1. Poor pain control 2. Reactions / problems with new medications (rash/itching, nausea, etc)  3. Fever over 101.5 F (38.5 C) 4. Inability to urinate 5. Nausea and/or vomiting 6. Worsening swelling or bruising 7. Continued bleeding from incision. 8. Increased pain, redness, or drainage from the incision  The clinic staff is available to answer your questions during regular business hours (8:30am-5pm).  Please dont hesitate to call and ask to speak to one of our nurses for clinical concerns.   A surgeon from [facility] is always on call at the hospitals   If you have a medical emergency, go to the nearest emergency room or call 911.    [facility], [place], [place], [place], [place]  [address] ? MAIN: [phone] ? TOLL FREE: [phone] ? FAX [phone] [email or web address]   GETTING TO GOOD BOWEL HEALTH. Irregular bowel habits such as constipation and diarrhea can lead to many problems over time.  Having one soft bowel movement a day is the most important way to prevent further problems.  The anorectal canal is designed to handle stretching and feces to safely manage our ability to get rid of solid waste (feces, poop, stool) out  of our body.  BUT, hard constipated stools can act like ripping concrete bricks and diarrhea can be a burning fire to this very sensitive area of our body, causing inflamed hemorrhoids, anal fissures, increasing risk is perirectal abscesses, abdominal pain/bloating, an making irritable bowel worse.     The goal: ONE SOFT BOWEL MOVEMENT A DAY!  To have soft, regular bowel movements:   Drink at least 8 tall glasses of water a day.    Take plenty of fiber.  Fiber is the undigested part of plant food that passes into the colon, acting s natures broom to encourage bowel motility and movement.  Fiber can absorb and hold large amounts of water. This results in a larger, bulkier stool, which is soft and easier to pass. Work gradually over several weeks up to 6 servings a day of fiber (25g a day even more if needed) in the form of: o Vegetables -- Root (potatoes, carrots, turnips), leafy green (lettuce, salad greens, celery, spinach), or cooked high residue (cabbage, broccoli, etc) o Fruit -- Fresh (unpeeled skin & pulp), Dried (prunes, apricots, cherries, etc ),  or stewed ( applesauce)  o Whole grain breads, pasta, etc (whole wheat)  o Bran cereals   Bulking Agents -- This type of water-retaining fiber generally is easily obtained each day by one of the following:  o Psyllium bran -- The psyllium plant is remarkable because its ground seeds can retain so much water. This product is available as Metamucil, Konsyl, Effersyllium, Per Diem Fiber, or the less expensive generic preparation in drug and health food stores. Although labeled a laxative, it really is not a laxative.  o Methylcellulose -- This is another fiber derived from wood which also retains water. It is available as Citrucel. o Polyethylene Glycol - and artificial fiber commonly called Miralax or Glycolax.  It is helpful for people with gassy or bloated feelings with regular fiber o Flax Seed - a less gassy fiber than psyllium  No reading or  other relaxing activity while on the toilet. If bowel movements take longer than 5 minutes, you are too constipated  AVOID CONSTIPATION.  High fiber and water intake usually takes care of this.  Sometimes a laxative is needed to stimulate more frequent bowel movements, but   Laxatives are not a good long-term solution as it can wear the colon out.  o Osmotics (Milk of Magnesia, Fleets phosphosoda, Magnesium citrate, MiraLax, GoLytely) are safer than  o Stimulants (Senokot, Castor Oil, Dulcolax, Ex Lax)    o Do not take laxatives for more than 7days in a row.   IF SEVERELY CONSTIPATED, try a Bowel Retraining Program: o Do not use laxatives.  o Eat a diet high in roughage, such as bran cereals and leafy vegetables.  o Drink six (6) ounces of prune or apricot juice each morning.  o Eat two (2) large servings of stewed fruit each day.  o Take one (1) heaping tablespoon of a psyllium-based bulking agent twice a day. Use sugar-free sweetener when possible to avoid excessive calories.  o Eat a normal breakfast.  o Set aside 15 minutes after breakfast to sit on the toilet, but do not strain to have a bowel movement.  o If you do not have a bowel movement by the third day, use an enema and repeat the above steps.   Controlling diarrhea o Switch to liquids and simpler foods for a few days to avoid stressing your intestines further. o Avoid dairy products (especially milk & ice cream) for a short time.  The intestines often can lose the ability to digest lactose when stressed. o Avoid foods that cause gassiness or bloating.  Typical foods include beans and other legumes, cabbage, broccoli, and dairy foods.  Every person has some sensitivity to other foods, so listen to our body and avoid those foods that trigger problems for you. o Adding fiber (Citrucel, Metamucil, psyllium, Miralax) gradually can help thicken stools by absorbing excess fluid and retrain the intestines to act more normally.  Slowly  increase the dose over a few weeks.  Too much fiber too soon can backfire and cause cramping & bloating. o Probiotics (such as active yogurt, Align, etc) may help repopulate the intestines and colon with normal bacteria and calm down a sensitive digestive tract.  Most studies show it to be of mild help, though, and such products can be costly. o Medicines: - Bismuth subsalicylate (ex. Kayopectate, Pepto Bismol) every 30 minutes for up to 6 doses can help control diarrhea.  Avoid if pregnant. - Loperamide (Immodium) can slow down diarrhea.  Start with two tablets (4mg  total) first and then try one tablet every 6 hours.  Avoid if you are having fevers or severe pain.  If you are not better or start feeling worse, stop all medicines and call your doctor for advice o Call your doctor if you are getting worse or not better.  Sometimes further testing (cultures, endoscopy, X-ray studies, bloodwork, etc) may be needed to help diagnose and treat the cause of the diarrhea.  Managing Pain  Pain after surgery or related to activity is often due to strain/injury to muscle, tendon, nerves and/or incisions.  This pain is usually short-term and will improve in a few months.   Many people find it helpful to do the following things TOGETHER to help speed the process of healing and to get back to regular activity more quickly:  1. Avoid heavy physical activity a.  no lifting greater than 20 pounds b. Do not push through the pain.  Listen to your body and avoid positions and maneuvers than reproduce the pain c. Walking is okay as tolerated, but go slowly and stop when getting sore.  d. Remember: If it hurts to do it, then dont do it! 2. Take Anti-inflammatory medication  a. Take with food/snack around the clock for 1-2 weeks  i. This helps the muscle and nerve tissues become less irritable and calm down faster b. Choose ONE of the following over-the-counter medications: i. Naproxen 220mg  tabs (ex. Aleve) 1-2  pills twice a day  ii. Ibuprofen 200mg  tabs (ex. Advil, Motrin) 3-4 pills with every meal and just before bedtime iii. Acetaminophen 500mg  tabs (Tylenol) 1-2 pills with every meal and just before bedtime 3. Use a Heating pad or Ice/Cold Pack a. 4-6 times a day b. May use warm bath/hottub  or showers 4. Try Gentle Massage and/or Stretching  a. at the area of pain many times a day b. stop if you feel pain - do not overdo it  Try these steps together to help you body heal faster and avoid making things get worse.  Doing just one of these things may not be enough.    If you are not getting better after two weeks or are noticing you are getting worse, contact our office for further advice; we may need to re-evaluate you & see what other things we can do to help.  Anal Fistula An anal fistula is an abnormal tunnel that develops between the bowel and skin near the outside of the anus, where feces comes out. The anus has a number of tiny glands that make lubricating fluid. Sometimes these glands can become plugged and infected. This may lead to the development of a fluid-filled pocket (abscess). An anal fistula often develops after this infection or abscess. It is nearly always caused by a past or current anal abscess.  CAUSES  Though an anal fistula is almost always caused by a past or current anal abscess, other causes can include:  A complication of surgery.  Trauma to the rectal area.  Radiation to the area.  Other medical conditions or diseases, such as:   Chronic inflammatory bowel disease, such as Crohn disease or ulcerative colitis.   Colon or rectal cancer.   Diverticular disease, such as diverticulitis.   A sexually transmitted disease, such as gonorrhea, chlamydia, or syphilis.  An HIV infection or AIDS.  SYMPTOMS   Throbbing or constant pain that may be worse when sitting.   Swelling or irritation around the anus.   Drainage of pus or blood from an opening near  the anus.   Pain with bowel movements.  Fever or chills. DIAGNOSIS  Your caregiver will examine the area to find the openings of the anal fistula and the fistula tract. The external opening of the anal fistula may be seen during a physical examination. Other examinations that may be performed include:   Examination of the rectal area with a gloved hand (digital rectal exam).   Examination with a probe or scope to help locate the internal opening of the fistula.   Injection of a dye into the fistula opening. X-rays can be taken to find the exact location and path of the fistula.   An MRI or ultrasound of the anal area.  Other tests may be performed to find the cause of the anal fistula.   TREATMENT  The most common treatment for an anal fistula is surgery. There are different surgery options depending on where your fistula is located and how complex the fistula is. Surgical options include:  A fistulotomy. This surgery involves opening up the whole fistula and draining the contents inside to promote healing.  Seton placement. A silk string (seton) is placed into the fistula during a fistulotomy to drain any infection to promote healing.  Advancement flap procedure.  Tissue is removed from your rectum or the skin around the anus and is attached to the opening of the fistula.  Bioprosthetic plug. A cone-shaped plug is made from your tissue and is used to block the opening of the fistula. Some anal fistulas do not require surgery. A fibrin glue is a non-surgical option that involves injecting the glue to seal the fistula. You also may be prescribed an antibiotic medicine to treat an infection.  HOME CARE INSTRUCTIONS   Take your antibiotics as directed. Finish them even if you start to feel better.  Only take over-the-counter or prescription medicines as directed by your caregiver.Use a stool softener or laxative, if recommended.   Eat a high-fiber diet to help avoid constipation  or as directed by your caregiver.  Drink enough water to keep your urine clear or pale yellow.   A warm sitz bath may be soothing and help with healing. You may take warm sitz baths for 15-20 minutes, 3-4 times a day to ease pain and discomfort.   Follow excellent hygiene to keep the anal area as clean and dry as possible. Use wet toilet paper or moist towelettes after each bowel movement.  SEEK MEDICAL CARE IF: You have increased pain not controlled with medicines.  SEEK IMMEDIATE MEDICAL CARE IF:  You have severe, intolerable pain.  You have new swelling, redness, or discharge around the anal area.  You have tenderness or warmth around the anal area.  You have chills or diarrhea.  You have severe problems urinating or having a bowel movement.   You have a fever or persistent symptoms for more than 2-3 days.   You have a fever and your symptoms suddenly get worse.  MAKE SURE YOU:   Understand these instructions.  Will watch your condition.  Will get help right away if you are not doing well or get worse. Document Released: 08/29/2008 Document Revised: 09/02/2012 Document Reviewed: 07/22/2011 [facility] Patient Information 2015 [place], [place]. This information is not intended to replace advice given to you by your health care provider. Make sure you discuss any questions you have with your health care provider.      General Anesthesia, Care After Refer to this sheet in the next few weeks. These instructions provide you with information on caring for yourself after your procedure. Your health care provider may also give you more specific instructions. Your treatment has been planned according to current medical practices, but problems sometimes occur. Call your health care provider if you have any problems or questions after your procedure. WHAT TO EXPECT AFTER THE PROCEDURE After the procedure, it is typical to experience:  Sleepiness.  Nausea and vomiting. HOME  CARE INSTRUCTIONS  For the first 24 hours after general anesthesia:  Have a responsible person with you.  Do not drive a car. If you are alone, do not take public transportation.  Do not drink alcohol.  Do not take medicine that has not been prescribed by your health care provider.  Do not sign important papers or make important decisions.  You may resume a normal diet and activities as directed by your health care provider.  Change bandages (dressings) as directed.  If you have questions or problems that seem related to general anesthesia, call the hospital and ask for the anesthetist or anesthesiologist on call. SEEK MEDICAL CARE IF:  You have nausea and vomiting that continue the day after anesthesia.  You develop a rash. SEEK IMMEDIATE MEDICAL CARE IF:   You have difficulty  breathing.  You have chest pain.  You have any allergic problems. Document Released: 12/23/2000 Document Revised: 09/21/2013 Document Reviewed: 04/01/2013 [facility] Patient Information 2015 [place], [place]. This information is not intended to replace advice given to you by your health care provider. Make sure you discuss any questions you have with your health care provider.

## 2014-08-18 NOTE — Op Note (Signed)
08/18/2014  1:23 PM  PATIENT:  Marcus [name]  43 y.o. 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  Fistulous tract.  2.  Internal hemorrhoid  DISPOSITION OF SPECIMEN:  PATHOLOGY  COUNTS:  YES  PLAN OF CARE: Discharge to home after PACU  PATIENT DISPOSITION:  PACU - hemodynamically stable.  INDICATION: Patient with probable perirectal fistula.  I recommended examination and surgical treatment:  The anatomy & physiology of the anorectal region was discussed.  We discussed the pathophysiology of anorectal abscess and fistula.  Differential diagnosis was discussed.  Natural history progression was discussed.   I stressed the importance of a bowel regimen to have daily soft bowel movements to minimize progression of disease.     The patient's condition is not adequately controlled.  Non-operative treatment has not healed the fistula.  Therefore, I recommended examination under anaesthesia to confirm the diagnosis and treat the fistula.  I discussed techniques that may be required such as fistulotomy, ligation by LIFT technique, and/or seton placement.  Benefits & alternatives discussed.  I noted a good likelihood this will help address the problem, but sometimes repeat operations and prolonged healing times may occur.  Risks such as bleeding, pain, recurrence,  reoperation, incontinence, heart attack, death, and other risks were discussed.      Educational handouts further explaining the pathology, treatment options, and bowel regimen were given.  The patient expressed understanding & wishes to proceed.  We will work to coordinate surgery for a mutually convenient time.   OR FINDINGS: Patient had a right posterior intersphincteric fistula.    External location  Right posterior lateral.    Internal location : posterior midline anal crypt about 1 cm from anal verge.  DESCRIPTION:   Informed consent was confirmed. Patient underwent general anesthesia without difficulty. Patient was placed into lithotomy positioning.  The perianal region was prepped and draped in sterile fashion. Surgical timeout confirmed or plan.  I did digital rectal examination and then transitioned over to anoscopy to get a sense of the anatomy.  I did place a probe through the external opening of the fistula.  With this I was able to locate the internal opening at the posterior midline anal crypts.  The tract did not feel superficial, concerning for a probable intersphincteric fistula.  Base of an irrtated right posterior internal hemorrhoid.  No abscess located.  I went ahead and proceeded with the LIFT technique.  I began to excise the external opening with a radial biconcave incision around it.  I used sharp scissors & cautery and help free the fistulous tract circumferentially all way down towards the sphincter component.  Did careful dissection to get down to the sphincter.  I placed 2-0 Vicryl stitches through the intersphincteric tract on the external side to involve the external & internal sphincters.  I transected the fistulous tract.  I removed the superficial end of the  fistulous tract  I then transitioned to the rectal component.  I went ahead and performed a right posterior internal hemorrhoidectomy to remove the hemorrhoidal pile including the internal opening.  I then  closed the wound with a 2-0 Vicryl suture running to the anal verge.  I tied that down to help pexy the component as well.  This help excise in closed the internal opening    I reexamined the anal canal.   There is was no narrowing.  Hemostasis was excellent.  I repeated anoscopy and examination.  Hemostasis was good.  I excised some perianal skin to help a have an open flattened external wound.  .  I packed the external wound with 1/4" ribbon NU gauze. Patient is being extubated go to recovery room.  I am about to discuss the patient's status to the family.  Instructions are written as well.  [name], M.D., F.A.C.S. Gastrointestinal and Minimally Invasive Surgery [facility] [address], [place], [place] [address] [phone] Main / Paging

## 2014-08-19 ENCOUNTER — Encounter (HOSPITAL_COMMUNITY): Payer: Self-pay | Admitting: Surgery

## 2014-08-20 ENCOUNTER — Encounter: Payer: Self-pay | Admitting: Family Medicine

## 2014-11-14 ENCOUNTER — Other Ambulatory Visit: Payer: 59

## 2014-11-21 ENCOUNTER — Encounter: Payer: 59 | Admitting: Family Medicine

## 2014-11-23 ENCOUNTER — Encounter: Payer: Self-pay | Admitting: Family Medicine

## 2014-11-23 ENCOUNTER — Ambulatory Visit (INDEPENDENT_AMBULATORY_CARE_PROVIDER_SITE_OTHER): Payer: BLUE CROSS/BLUE SHIELD | Admitting: Family Medicine

## 2014-11-23 VITALS — BP 100/70 | HR 64 | Temp 98.1°F | Ht 67.5 in | Wt 166.5 lb

## 2014-11-23 DIAGNOSIS — M5441 Lumbago with sciatica, right side: Secondary | ICD-10-CM

## 2014-11-23 DIAGNOSIS — M545 Low back pain, unspecified: Secondary | ICD-10-CM | POA: Insufficient documentation

## 2014-11-23 DIAGNOSIS — Z0000 Encounter for general adult medical examination without abnormal findings: Secondary | ICD-10-CM

## 2014-11-23 DIAGNOSIS — Z23 Encounter for immunization: Secondary | ICD-10-CM

## 2014-11-23 DIAGNOSIS — K603 Anal fistula: Secondary | ICD-10-CM

## 2014-11-23 NOTE — Assessment & Plan Note (Signed)
rec restart exercises he has at home as well as OTC NSAID prn symptomatic relief esp in setting of upcoming long flight to [place]

## 2014-11-23 NOTE — Patient Instructions (Addendum)
Flu shot today. Regresar en ayunas para sangre. Todo anda bien. siga revision con Dr Johney Maine. regrese en 1 ao para proximo examen fisico. Gusto verlo hoy, llamenos con preguntas.

## 2014-11-23 NOTE — Assessment & Plan Note (Signed)
Followed by gen surgery. Appreciate their care of patient.

## 2014-11-23 NOTE — Progress Notes (Signed)
Pre visit review using our clinic review tool, if applicable. No additional management support is needed unless otherwise documented below in the visit note. 

## 2014-11-23 NOTE — Progress Notes (Signed)
BP 100/70 mmHg  Pulse 64  Temp(Src) 98.1 F (36.7 C) (Oral)  Ht 5' 7.5" (1.715 m)  Wt 166 lb 8 oz (75.524 kg)  BMI 25.68 kg/m2   CC: CPE  Subjective:    Patient ID: Marcus [name], male    DOB: Dec 30, 1970, 44 y.o.   MRN: [id number]  HPI: Marcus [name] is a 44 y.o. male presenting on 11/23/2014 for Annual Exam   This past year had gallbladder pancreatitis s/p cholecystectomy. Also had perianal fistula repair 07/2014 (internal hemorrhoidectomy + ligation of intersphincteric fistula tract) followed by Dr Johney Maine. Slowly healing.   Some L sciatic nerve irritation. comes and goes.   Preventative: Flu shot - today Tetanus - unsure but thinks within last 5 years  Spanish speaking, from [place] Lives with wife and 2 sons and [place] From [place], [place].  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 Interim medical history since our last visit reviewed. Allergies and medications reviewed and updated. No current outpatient prescriptions on file prior to visit.   No current facility-administered medications on file prior to visit.    Review of Systems  Constitutional: Negative for fever, chills, activity change, appetite change, fatigue and unexpected weight change.  HENT: Negative for hearing loss.   Eyes: Negative for visual disturbance.  Respiratory: Negative for cough, chest tightness, shortness of breath and wheezing.   Cardiovascular: Negative for chest pain, palpitations and leg swelling.  Gastrointestinal: Negative for nausea, vomiting, abdominal pain, diarrhea, constipation, blood in stool and abdominal distention.  Genitourinary: Negative for hematuria and difficulty urinating.  Musculoskeletal: Negative for myalgias, arthralgias and neck pain.  Skin: Negative for rash.   Neurological: Negative for dizziness, seizures, syncope and headaches.  Hematological: Negative for adenopathy. Does not bruise/bleed easily.  Psychiatric/Behavioral: Negative for dysphoric mood. The patient is not nervous/anxious.    Per HPI unless specifically indicated above     Objective:    BP 100/70 mmHg  Pulse 64  Temp(Src) 98.1 F (36.7 C) (Oral)  Ht 5' 7.5" (1.715 m)  Wt 166 lb 8 oz (75.524 kg)  BMI 25.68 kg/m2  Wt Readings from Last 3 Encounters:  11/23/14 166 lb 8 oz (75.524 kg)  08/18/14 162 lb (73.483 kg)  06/20/14 160 lb 6.4 oz (72.757 kg)    Physical Exam  Constitutional: He is oriented to person, place, and time. He appears well-developed and well-nourished. No distress.  HENT:  Head: Normocephalic and atraumatic.  Right Ear: Hearing, tympanic membrane, external ear and ear canal normal.  Left Ear: Hearing, tympanic membrane, external ear and ear canal normal.  Nose: Nose normal.  Mouth/Throat: Uvula is midline, oropharynx is clear and moist and mucous membranes are normal. No oropharyngeal exudate, posterior oropharyngeal edema or posterior oropharyngeal erythema.  Eyes: Conjunctivae and EOM are normal. Pupils are equal, round, and reactive to light. No scleral icterus.  Neck: Normal range of motion. Neck supple.  Cardiovascular: Normal rate, regular rhythm, normal heart sounds and intact distal pulses.   No murmur heard. Pulses:      Radial pulses are 2+ on the right side, and 2+ on the left side.  Pulmonary/Chest: Effort normal and breath sounds normal. No respiratory distress. He has no wheezes. He has no rales.  Abdominal: Soft. Bowel sounds are normal. He exhibits no distension and no mass. There is no tenderness. There  is no rebound and no guarding.  Musculoskeletal: Normal range of motion. He exhibits no edema.  Lymphadenopathy:    He has no cervical adenopathy.  Neurological: He is alert and oriented to person, place, and time.  CN grossly intact,  station and gait intact  Skin: Skin is warm and dry. No rash noted.  Psychiatric: He has a normal mood and affect. His behavior is normal. Judgment and thought content normal.  Nursing note and vitals reviewed.     Assessment & Plan:   Problem List Items Addressed This Visit    Right low back pain    rec restart exercises he has at home as well as OTC NSAID prn symptomatic relief esp in setting of upcoming long flight to [place]      Perianal fistula    Followed by gen surgery. Appreciate their care of patient.      Health maintenance examination - Primary    Preventative protocols reviewed and updated unless pt declined. Discussed healthy diet and lifestyle.  Not fasting today - will return for labs       Other Visit Diagnoses    Need for influenza vaccination        Relevant Orders    Flu Vaccine QUAD 36+ mos PF IM (Fluarix Quad PF) (Completed)        Follow up plan: Return as needed, for annual exam, prior fasting for blood work.

## 2014-11-23 NOTE — Assessment & Plan Note (Addendum)
Preventative protocols reviewed and updated unless pt declined. Discussed healthy diet and lifestyle.  Not fasting today - will return for labs

## 2014-11-24 ENCOUNTER — Encounter: Payer: Self-pay | Admitting: *Deleted

## 2014-11-24 ENCOUNTER — Other Ambulatory Visit (INDEPENDENT_AMBULATORY_CARE_PROVIDER_SITE_OTHER): Payer: BLUE CROSS/BLUE SHIELD

## 2014-11-24 DIAGNOSIS — Z0000 Encounter for general adult medical examination without abnormal findings: Secondary | ICD-10-CM

## 2014-11-24 LAB — COMPREHENSIVE METABOLIC PANEL
ALBUMIN: 4.2 g/dL (ref 3.5–5.2)
ALT: 30 U/L (ref 0–53)
AST: 17 U/L (ref 0–37)
Alkaline Phosphatase: 66 U/L (ref 39–117)
BUN: 11 mg/dL (ref 6–23)
CHLORIDE: 106 meq/L (ref 96–112)
CO2: 28 mEq/L (ref 19–32)
Calcium: 9.3 mg/dL (ref 8.4–10.5)
Creatinine, Ser: 0.94 mg/dL (ref 0.40–1.50)
GFR: 93.42 mL/min (ref >60.00)
Glucose, Bld: 94 mg/dL (ref 70–99)
POTASSIUM: 4.2 meq/L (ref 3.5–5.1)
Sodium: 139 mEq/L (ref 135–145)
Total Bilirubin: 0.6 mg/dL (ref 0.2–1.2)
Total Protein: 7 g/dL (ref 6.0–8.3)

## 2014-11-24 LAB — LIPID PANEL
Cholesterol: 173 mg/dL (ref 0–200)
HDL: 44.9 mg/dL (ref >39.00)
LDL CALC: 118 mg/dL — AB (ref 0–99)
NonHDL: 128.1
Total CHOL/HDL Ratio: 4
Triglycerides: 50 mg/dL (ref 0.0–149.0)
VLDL: 10 mg/dL (ref 0.0–40.0)

## 2015-02-24 ENCOUNTER — Ambulatory Visit (INDEPENDENT_AMBULATORY_CARE_PROVIDER_SITE_OTHER): Payer: BLUE CROSS/BLUE SHIELD | Admitting: Family Medicine

## 2015-02-24 ENCOUNTER — Encounter: Payer: Self-pay | Admitting: Family Medicine

## 2015-02-24 VITALS — BP 102/74 | HR 58 | Temp 98.4°F | Wt 165.0 lb

## 2015-02-24 DIAGNOSIS — R21 Rash and other nonspecific skin eruption: Secondary | ICD-10-CM | POA: Insufficient documentation

## 2015-02-24 MED ORDER — TRIAMCINOLONE ACETONIDE 0.5 % EX CREA: 1.0000 "application " | TOPICAL_CREAM | Freq: Three times a day (TID) | CUTANEOUS | Status: DC | PRN

## 2015-02-24 NOTE — Patient Instructions (Signed)
Use the cream 2-3 times a day.  That should help the itching and the redness should gradually fade away.  If you have fever or pain (or if the rash starts to spread a lot), then update me.   You can call the on-call line this weekend if needed.  Take care.  Glad to see you.

## 2015-02-24 NOTE — Assessment & Plan Note (Signed)
Likely inflammatory, but likely not infectious.  Presumed environmental exposure, but no clear source known.  D/w pt.  Would use TAC 0.5% cream for now, update me as needed.  See AVS.  I'm on call this weekend, he can update me as needed.  He agrees.  Routed to PCP as FYI.

## 2015-02-24 NOTE — Progress Notes (Signed)
Pre visit review using our clinic review tool, if applicable. No additional management support is needed unless otherwise documented below in the visit note.  Rash on L shin, for about 2 days.  Itchy.  Using OTC cream w/o relief.  No known poison ivy exposure.  No known insect bites.  No FCNAVD.  No pain at the rash.    Meds, vitals, and allergies reviewed.   ROS: See HPI.  Otherwise, noncontributory.  nad L knee with normal inspection 2 red areas inferior to L knee, on the upper shin, each about 3cm across, irregular shape.  A few smaller <<1cm lesions scattered in the area.  Blanching, slightly raised, itchy.  No ulceration.

## 2015-11-29 ENCOUNTER — Ambulatory Visit (INDEPENDENT_AMBULATORY_CARE_PROVIDER_SITE_OTHER): Payer: BLUE CROSS/BLUE SHIELD | Admitting: Family Medicine

## 2015-11-29 ENCOUNTER — Encounter: Payer: Self-pay | Admitting: Family Medicine

## 2015-11-29 VITALS — BP 108/70 | HR 64 | Temp 97.9°F | Wt 171.2 lb

## 2015-11-29 DIAGNOSIS — K219 Gastro-esophageal reflux disease without esophagitis: Secondary | ICD-10-CM | POA: Diagnosis not present

## 2015-11-29 DIAGNOSIS — K603 Anal fistula: Secondary | ICD-10-CM | POA: Diagnosis not present

## 2015-11-29 DIAGNOSIS — E663 Overweight: Secondary | ICD-10-CM

## 2015-11-29 DIAGNOSIS — Z0000 Encounter for general adult medical examination without abnormal findings: Secondary | ICD-10-CM

## 2015-11-29 LAB — LIPID PANEL
CHOL/HDL RATIO: 4
Cholesterol: 171 mg/dL (ref 0–200)
HDL: 45.1 mg/dL (ref >39.00)
LDL CALC: 115 mg/dL — AB (ref 0–99)
NONHDL: 126.23
Triglycerides: 56 mg/dL (ref 0.0–149.0)
VLDL: 11.2 mg/dL (ref 0.0–40.0)

## 2015-11-29 LAB — BASIC METABOLIC PANEL
BUN: 12 mg/dL (ref 6–23)
CHLORIDE: 106 meq/L (ref 96–112)
CO2: 27 mEq/L (ref 19–32)
Calcium: 9.2 mg/dL (ref 8.4–10.5)
Creatinine, Ser: 0.89 mg/dL (ref 0.40–1.50)
GFR: 99.02 mL/min (ref >60.00)
Glucose, Bld: 89 mg/dL (ref 70–99)
POTASSIUM: 4.2 meq/L (ref 3.5–5.1)
SODIUM: 139 meq/L (ref 135–145)

## 2015-11-29 NOTE — Assessment & Plan Note (Signed)
Preventative protocols reviewed and updated unless pt declined. Discussed healthy diet and lifestyle.  

## 2015-11-29 NOTE — Progress Notes (Signed)
Pre visit review using our clinic review tool, if applicable. No additional management support is needed unless otherwise documented below in the visit note. 

## 2015-11-29 NOTE — Assessment & Plan Note (Signed)
Mild. Discussed prn zantac and tums. Update if worsening sxs.

## 2015-11-29 NOTE — Addendum Note (Signed)
Addended by: Daralene Milch C on: 11/29/2015 10:24 AM   Modules accepted: Miquel Dunn

## 2015-11-29 NOTE — Progress Notes (Signed)
BP 108/70 mmHg  Pulse 64  Temp(Src) 97.9 F (36.6 C) (Oral)  Wt 171 lb 4 oz (77.678 kg)   CC: CPE  Subjective:    Patient ID: Marcus [name], male    DOB: March 12, 1971, 45 y.o.   MRN: [id number]  HPI: Kmarion Whitehouse is a 45 y.o. male presenting on 11/29/2015 for Annual Exam  2015 - gallbladder pancreatitis s/p cholecystectomy.  2015 - perianal fistula repair (internal hemorrhoidectomy + ligation of intersphincteric fistula tract)  Preventative: Flu shot - declines today. Tetanus - unsure but thinks within last 5 years Seat belt use discussed Sunscreen use discussed. Denies changing moles on skin.  Spanish speaking, from [place] Lives with wife and 2 sons and [place] From [place], [place].  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 Interim medical history since our last visit reviewed. Allergies and medications reviewed and updated. No current outpatient prescriptions on file prior to visit.   No current facility-administered medications on file prior to visit.    Review of Systems  Constitutional: Negative for fever, chills, activity change, appetite change, fatigue and unexpected weight change.  HENT: Negative for hearing loss.   Eyes: Positive for visual disturbance (more regular use of readers).  Respiratory: Negative for cough, chest tightness, shortness of breath and wheezing.   Cardiovascular: Negative for chest pain, palpitations and leg swelling.  Gastrointestinal: Negative for nausea, vomiting, abdominal pain, diarrhea, constipation, blood in stool and abdominal distention.  Genitourinary: Negative for hematuria and difficulty urinating.  Musculoskeletal: Negative for myalgias, arthralgias and neck pain.  Skin: Negative for rash.  Neurological: Negative for  dizziness, seizures, syncope and headaches.  Hematological: Negative for adenopathy. Does not bruise/bleed easily.  Psychiatric/Behavioral: Negative for dysphoric mood. The patient is nervous/anxious (mild, work related stress).    Per HPI unless specifically indicated in ROS section     Objective:    BP 108/70 mmHg  Pulse 64  Temp(Src) 97.9 F (36.6 C) (Oral)  Wt 171 lb 4 oz (77.678 kg)  Wt Readings from Last 3 Encounters:  11/29/15 171 lb 4 oz (77.678 kg)  02/24/15 165 lb (74.844 kg)  11/23/14 166 lb 8 oz (75.524 kg)   Body mass index is 26.41 kg/(m^2).  Physical Exam  Constitutional: He is oriented to person, place, and time. He appears well-developed and well-nourished. No distress.  HENT:  Head: Normocephalic and atraumatic.  Right Ear: Hearing, tympanic membrane, external ear and ear canal normal.  Left Ear: Hearing, tympanic membrane, external ear and ear canal normal.  Nose: Nose normal.  Mouth/Throat: Uvula is midline, oropharynx is clear and moist and mucous membranes are normal. No oropharyngeal exudate, posterior oropharyngeal edema or posterior oropharyngeal erythema.  Eyes: Conjunctivae and EOM are normal. Pupils are equal, round, and reactive to light. No scleral icterus.  Neck: Normal range of motion. Neck supple.  Cardiovascular: Normal rate, regular rhythm, normal heart sounds and intact distal pulses.   No murmur heard. Pulses:      Radial pulses are 2+ on the right side, and 2+ on the left side.  Pulmonary/Chest: Effort normal and breath sounds normal. No respiratory distress. He has no wheezes. He has no rales.  Abdominal: Soft. Bowel sounds are normal. He exhibits no distension and no mass. There is no tenderness. There is no rebound and no guarding.  Musculoskeletal: Normal range of  motion. He exhibits no edema.  Lymphadenopathy:    He has no cervical adenopathy.  Neurological: He is alert and oriented to person, place, and time.  CN grossly intact,  station and gait intact  Skin: Skin is warm and dry. No rash noted.  Psychiatric: He has a normal mood and affect. His behavior is normal. Judgment and thought content normal.  Nursing note and vitals reviewed.  Results for orders placed or performed in visit on 11/24/14  Comprehensive metabolic panel  Result Value Ref Range   Sodium 139 135 - 145 mEq/L   Potassium 4.2 3.5 - 5.1 mEq/L   Chloride 106 96 - 112 mEq/L   CO2 28 19 - 32 mEq/L   Glucose, Bld 94 70 - 99 mg/dL   BUN 11 6 - 23 mg/dL   Creatinine, Ser 0.94 0.40 - 1.50 mg/dL   Total Bilirubin 0.6 0.2 - 1.2 mg/dL   Alkaline Phosphatase 66 39 - 117 U/L   AST 17 0 - 37 U/L   ALT 30 0 - 53 U/L   Total Protein 7.0 6.0 - 8.3 g/dL   Albumin 4.2 3.5 - 5.2 g/dL   Calcium 9.3 8.4 - 10.5 mg/dL   GFR 93.42 >60.00 mL/min  Lipid panel  Result Value Ref Range   Cholesterol 173 0 - 200 mg/dL   Triglycerides 50.0 0.0 - 149.0 mg/dL   HDL 44.90 >39.00 mg/dL   VLDL 10.0 0.0 - 40.0 mg/dL   LDL Cholesterol 118 (H) 0 - 99 mg/dL   Total CHOL/HDL Ratio 4    NonHDL 128.10       Assessment & Plan:   Problem List Items Addressed This Visit    Perianal fistula    S/p repair 2015.      Health maintenance examination - Primary    Preventative protocols reviewed and updated unless pt declined. Discussed healthy diet and lifestyle.       GERD (gastroesophageal reflux disease)    Mild. Discussed prn zantac and tums. Update if worsening sxs.        Other Visit Diagnoses    Overweight        Relevant Orders    Lipid panel    Basic metabolic panel        Follow up plan: Return in about 1 year (around 11/28/2016), or as needed, for annual exam, prior fasting for blood work.

## 2015-11-29 NOTE — Assessment & Plan Note (Signed)
S/p repair 2015.

## 2015-11-29 NOTE — Patient Instructions (Addendum)
[place].  Revisemos examen de sangre hoy.  Gusto verlo hoy - llamenos con preguntas. Trate zantac o pepcid para acidez/reflujo.  Mantenimiento de Technical sales engineer - Hombres (Health Maintenance, Male) Un estilo de vida saludable y los cuidados preventivos pueden favorecer la salud y [place].  No deje de [employer] de rutina de la salud, dentales y de Public librarian.  Consuma una dieta saludable. Los [employer], frutas, cereales integrales, productos lcteos descremados y protenas magras contienen los nutrientes que usted necesita y no tienen muchas caloras. Disminuya la ingesta de alimentos ricos en grasas slidas, azcares y sal agregadas. Si es necesario, pdale informacin acerca de una dieta [place] a su mdico.  Realizar actividad fsica de forma regular es una de las prcticas ms importantes que puede hacer por su salud. Los adultos deben hacer al menos 150 minutos de ejercicios de intensidad moderada (cualquier actividad que aumente la frecuencia cardaca y lo haga transpirar) cada semana. Adems, la [employer] de los adultos necesita practicar ejercicios de fortalecimiento muscular dos o ms veces por semana.  Mantenga un peso saludable. El ndice de masa corporal [facility]) es una herramienta que identifica posibles problemas con [place]. Proporciona una estimacin de la grasa corporal basndose en el peso y la altura. El mdico podr determinar su [facility] y ayudarlo a Scientist, forensic o Theatre manager un peso saludable. Para los adultos mayores de 20aos:  Un [facility] menor de 18,5 se considera bajo peso.  Un [facility] entre 18,5 y 24,9 es normal.  Un [facility] entre 25 y 29,9 se considera sobrepeso.  Un IMC de 30 o ms se considera obesidad.  Mantenga un nivel normal de lpidos y colesterol en la sangre practicando actividad fsica y minimizando la ingesta de grasas saturadas. Consuma una dieta balanceada e incluya variedad de frutas y vegetales. A partir de los 20 aos se deben realizar anlisis de  sangre a fin de Freight forwarder nivel de lpidos y colesterol en la sangre y [place] cada 5 aos. Si los niveles de colesterol son altos, tiene ms de 50 aos o tiene riesgo elevado de sufrir enfermedades cardacas, Designer, industrial/product controlarse con ms frecuencia. Si tiene [employer] de lpidos y colesterol, debe recibir tratamiento con medicamentos, si la dieta y el ejercicio no estn funcionando.  Si fuma, consulte con el mdico acerca de las opciones para dejar de [place]. Si no fuma, no comience.  Se recomienda realizar exmenes de deteccin de cncer de pulmn a personas adultas entre 72 y 64 aos que estn en riesgo de Horticulturist, commercial de pulmn por sus antecedentes de consumo de tabaco. Para quienes hayan fumado durante 30 aos un paquete diario, y sigan fumando o hayan dejado el hbito en algn momento en los ltimos 15 aos, se recomienda realizarse una tomografa computada de baja dosis de los pulmones todos los [place]. Fumar un paquete por ao equivale a fumar un promedio de un paquete de cigarrillos diario durante un ao (por ejemplo, fumar 30paquetes por ao podra significar fumar un paquete de cigarrillos diario durante 30aos o 2paquetes diarios durante 15aos). Los exmenes anuales deben continuar hasta que el fumador haya dejado de fumar durante un mnimo de 15 aos. Ya no deben Emergency planning/management officer que tengan un problema de salud que les impida recibir tratamiento para el cncer de pulmn.  Si decide tomar alcohol, no beba ms de [employer]. Se considera una medida 12onzas (369m) de cerveza, 5onzas (1546m de vino o 1,[id number] licor.  Evite  el consumo de drogas. No comparta agujas. Pida ayuda si necesita asistencia o instrucciones con respecto a abandonar el consumo de drogas.  La hipertensin arterial causa enfermedades cardacas y [place] el riesgo de ictus. La hipertensin arterial es ms probable en los siguientes casos:  Las personas que tienen la  presin arterial en el extremo del rango normal (100-139/85-89 mm Hg).  Las personas con sobrepeso u obesidad.  Las Retail banker.  Si usted tiene entre 18 y 39 aos, debe medirse la presin arterial cada 3 a 5 aos. Si usted tiene 40 aos o ms, debe medirse la presin arterial [employer]. Debe medirse la presin arterial dos veces: una vez cuando est en un hospital o una clnica y la otra vez cuando est en otro sitio. Registre el promedio de [employer]. Para controlar su presin arterial cuando no est en un hospital o [place], puede usar lo siguiente:  Ardelia Mems mquina automtica para medir la presin arterial en una farmacia.  Un monitor para medir la presin arterial en el hogar.  Si tiene entre 70 y 52 aos, consulte a su mdico si debe tomar aspirina para prevenir enfermedades cardacas.  Los anlisis para la diabetes incluyen la toma de [place] de sangre para controlar el nivel de azcar en la sangre durante el Vida. Debe hacerlos cada 3aos despus de los 11aos si su peso es normal y no tiene factores de riesgo de diabetes. Las pruebas deben comenzar a edades tempranas o llevarse a cabo con ms frecuencia si tiene sobrepeso y al menos un factor de riesgo para la diabetes.  El cncer colorrectal puede detectarse y con frecuencia puede prevenirse. La mayor parte de los estudios de rutina se deben Medical laboratory scientific officer a Field seismologist a Proofreader de los 65 aos y [place] 47 aos. Sin embargo, el mdico podr aconsejarle que lo haga antes, si tiene factores de riesgo para el cncer de colon. Una vez por ao, el mdico le dar un kit de prueba para Hydrologist en la materia fecal. Es posible que se use una pequea cmara en el extremo de un tubo para examinar directamente el colon (sigmoidoscopa o colonoscopa) para Hydrographic surveyor formas tempranas de cncer colorrectal. Hable sobre esto con su mdico si tiene 33 aos, edad a la que [employer] estudios de [place]. El examen directo  del colon debe repetirse cada cinco a 10 aos, hasta los 75 aos, excepto que se encuentren formas tempranas de plipos precancerosos o pequeos bultos.  Las personas con un riesgo mayor de Insurance risk surveyor hepatitis B deben realizarse anlisis para Futures trader virus. Se considera que tiene un alto riesgo de Museum/gallery curator hepatitis B si:  Naci en un pas donde la hepatitis B es frecuente. Pregntele a su mdico qu pases son considerados de Public affairs consultant.  Sus padres nacieron en un pas de alto riesgo y usted no recibi una vacuna que lo proteja contra la hepatitis B (vacuna contra la hepatitis B).  [place].  [place] agujas para inyectarse drogas.  Vive o tiene sexo con alguien que tiene hepatitis B.  Es un hombre que tiene sexo con otros hombres.  Recibe tratamiento de hemodilisis.  Toma ciertos medicamentos para Nurse, mental health, trasplante de rganos y afecciones autoinmunes.  Se recomienda realizar un anlisis de sangre para Hydrographic surveyor hepatitis C a todas las personas nacidas entre 1945 y 1965, y a toda persona que tenga un riesgo de haber contrado esta enfermedad.  Los hombres sanos no deben [employer]  anlisis de sangre para Hydrographic surveyor antgenos especficos prostticos (PSA) como parte de los estudios de rutina para Science writer. Pregntele a su mdico sobre las pruebas de deteccin de cncer de prstata.  La evaluacin del cncer de testculos no se recomienda en hombres adolescentes ni adultos que no tengan sntomas. La evaluacin incluye el autoexamen, el examen por parte del mdico y otras pruebas diagnsticas. Consulte con su mdico si tiene algn sntoma o preocupaciones acerca del cncer de testculos.  Practique el sexo seguro. Use condones y evite las prcticas sexuales riesgosas para disminuir el contagio de enfermedades de transmisin sexual (ETS).  Debe realizarse pruebas de deteccin de ETS, incluidas la gonorrea y la clamidia si:  Es sexualmente activa y es menor de  [place].  Es mayor de 40aos y Investment banker, operational dice que est en riesgo de padecer esta infeccin.  La actividad sexual ha cambiado desde que le hicieron la ltima prueba de deteccin y tiene un riesgo mayor de Best boy clamidia o Radio broadcast assistant. Pregntele al mdico si usted tiene riesgo.  Si tiene riesgo de infectarse por el VIH, se recomienda tomar diariamente un medicamento recetado para evitar la infeccin. Esto se conoce como profilaxis previa a la exposicin. Se considera que est en riesgo si:  Es un hombre que tiene sexo con otros hombres.  Es heterosexual y es activo sexualmente con mltiples parejas.  Se inyecta drogas.  Es sexualmente activo con una pareja que tiene VIH.  Consulte a su mdico para saber si tiene un alto riesgo de infectarse por el VIH. Si opta por comenzar la profilaxis previa a la exposicin, primero debe realizarse anlisis de deteccin del VIH. Luego, le harn anlisis cada 57mses mientras est tomando los medicamentos para la profilaxis previa a la exposicin.  Utilice pantalla solar. Aplique pantalla solar de mKerry Doryy repetida a lo largo del dTraining and development officer Resgurdese del sol cuando la sombra sea ms pequea que usted. Protjase usando mangas y pThe [employer] un sombrero de ala ancha y gafas para el sol todo el ao, siempre que se encuentre en el exterior.  Informe a su mdico si aparecen nuevos lunares o los que tiene se modifican, especialmente en forma y color. Tambin notifique al mdico si un lunar es ms grande que el tamao de una goma de lGames developer  Si tiene entre 673y 757aos y es o ha sido fumador, se recomienda un estudio con ecografa para dEnvironmental managerde aorta abdominal (AAA) y su eventual reparacin qUnited [place]  MCushman(inmunizaciones).   Esta informacin no tiene cMarine scientistel consejo del mdico. Asegrese de hacerle al mdico cualquier pregunta que tenga.   Document Released: 03/14/2008 Document Revised:  10/07/2014 Elsevier Interactive Patient Education 2[employer]

## 2015-12-04 ENCOUNTER — Encounter: Payer: Self-pay | Admitting: *Deleted

## 2016-08-13 IMAGING — US US ABDOMEN COMPLETE
1 series · 14 of 25 positions shown · non-contrast
Comparison: None.

CLINICAL DATA: Abdomen pain, nausea and vomiting.

EXAM:
ULTRASOUND ABDOMEN COMPLETE

[Series 1: us abdomen complete · 0.21mm/px · 14 of 89 slices shown]
[im 1/89]
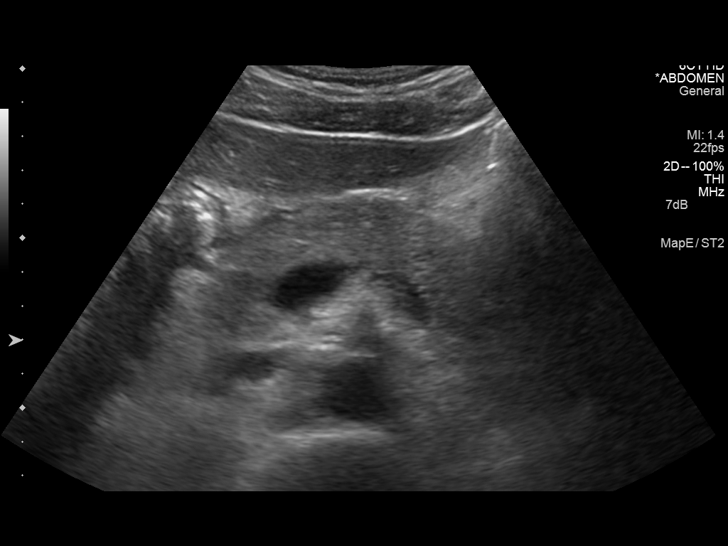
[im 8/89]
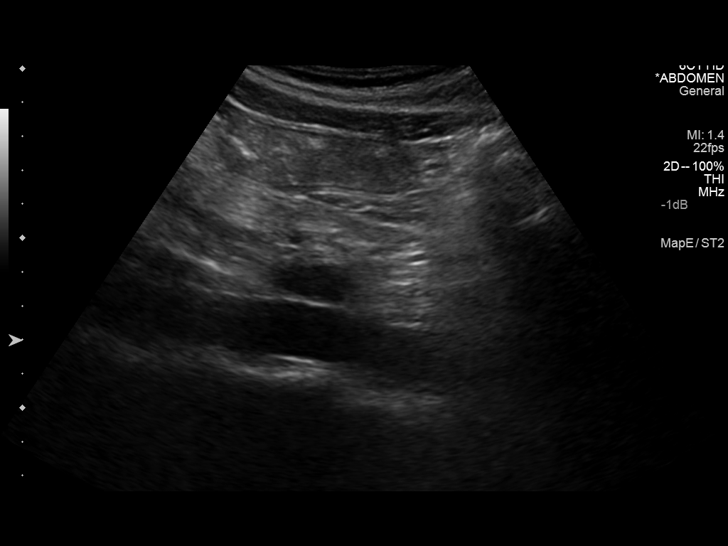
[im 15/89]
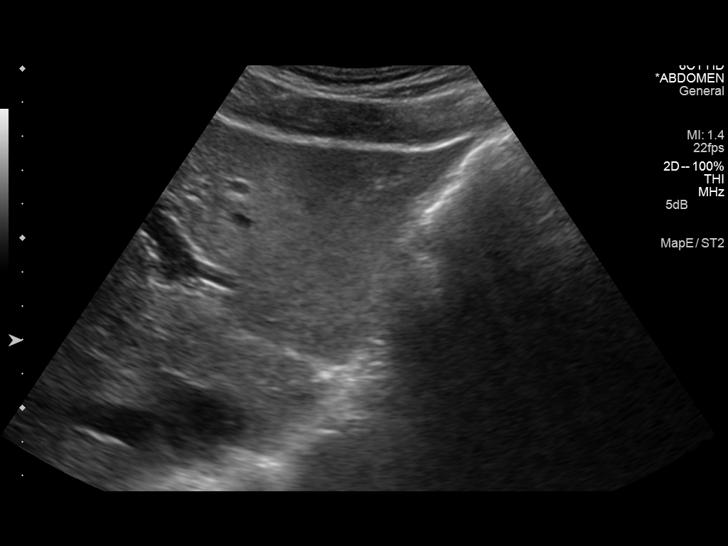
[im 23/89]
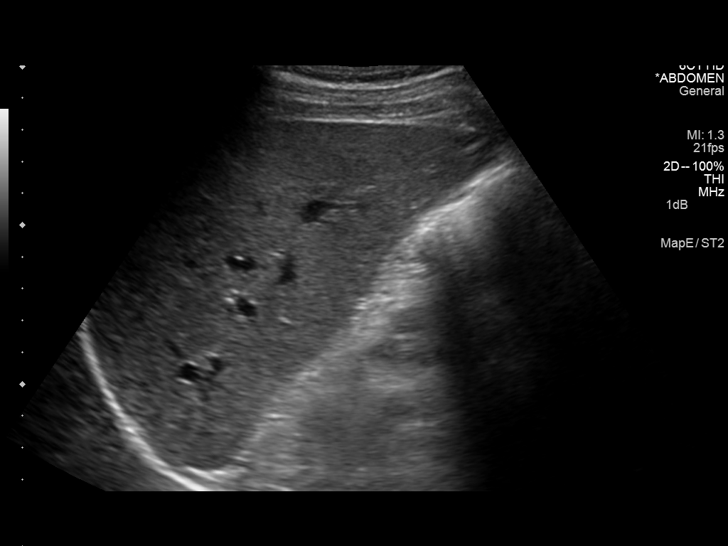
[im 30/89]
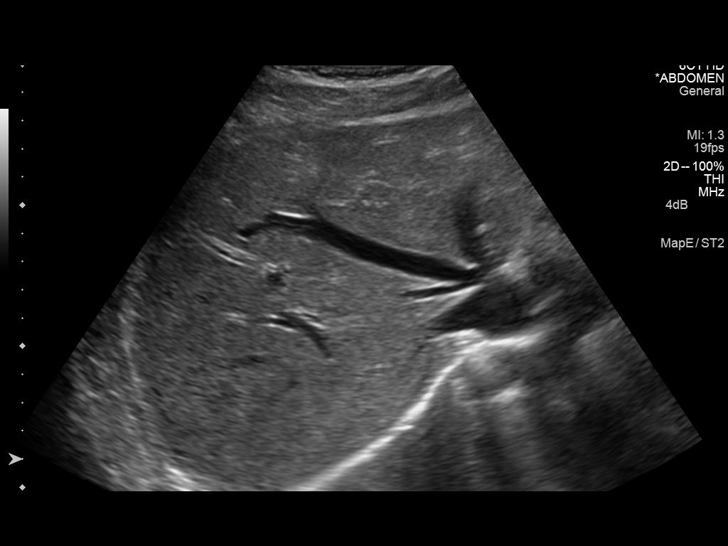
[im 34/89]
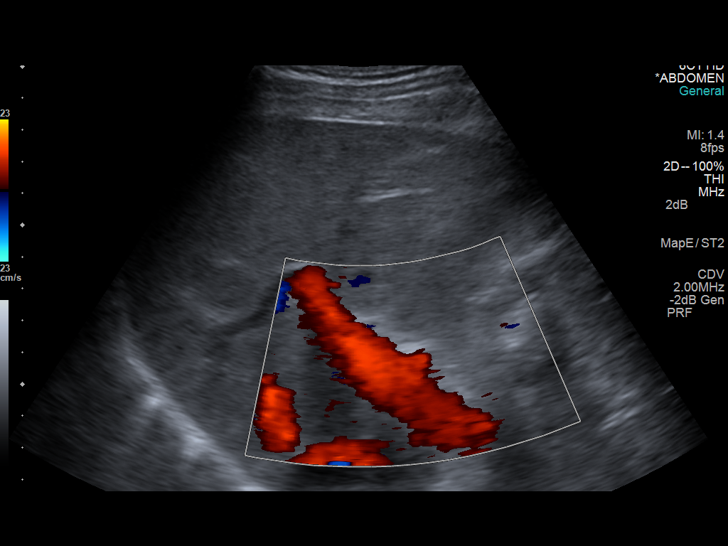
[im 41/89]
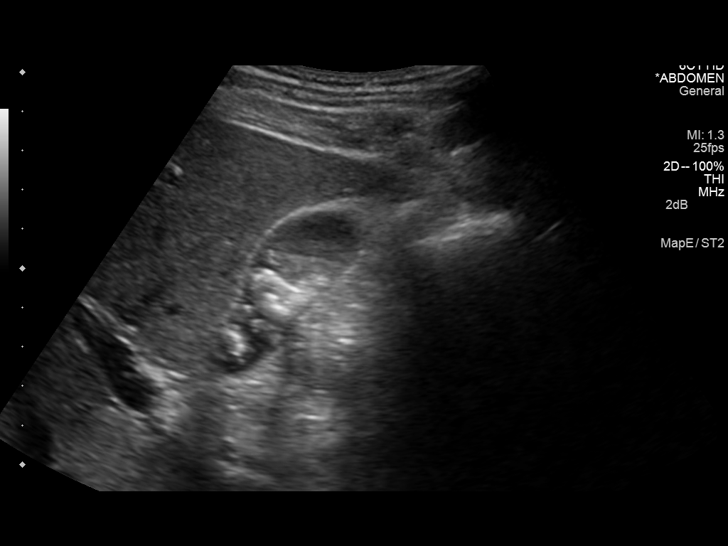
[im 48/89]
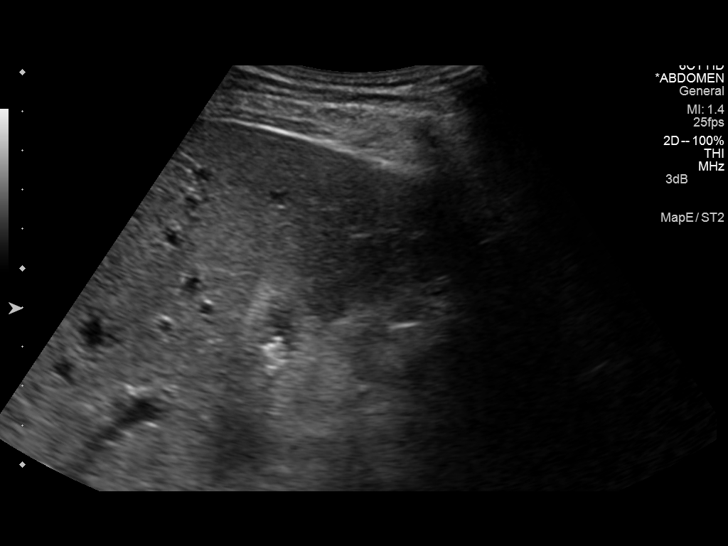
[im 56/89]
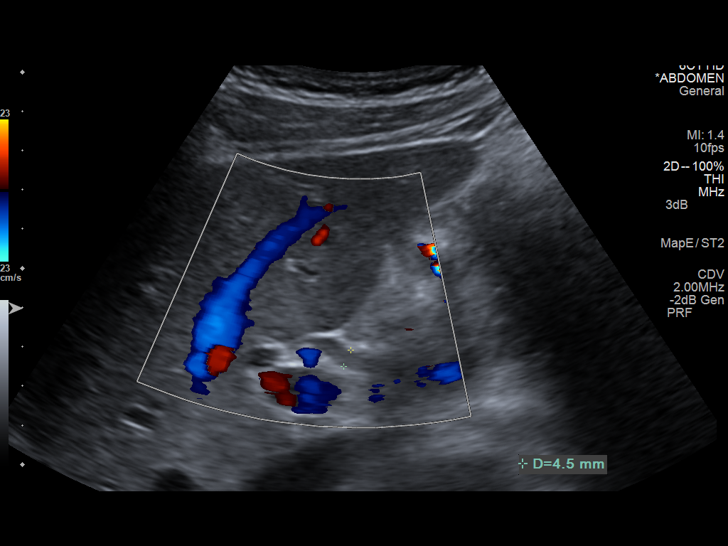
[im 59/89]
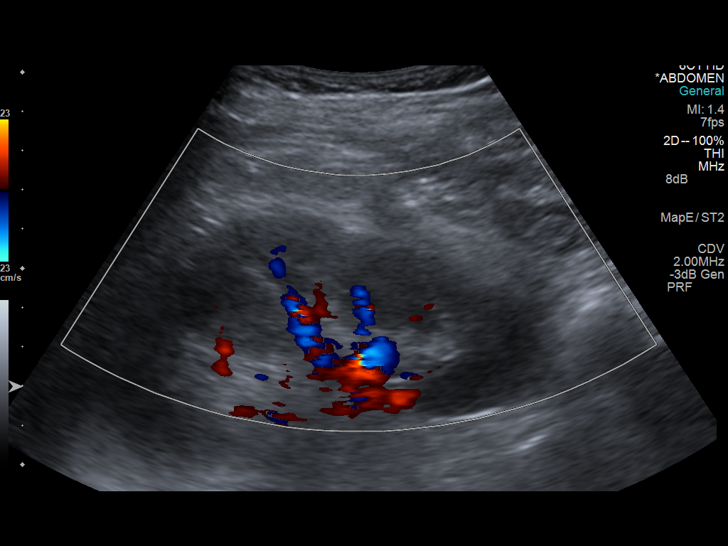
[im 67/89]
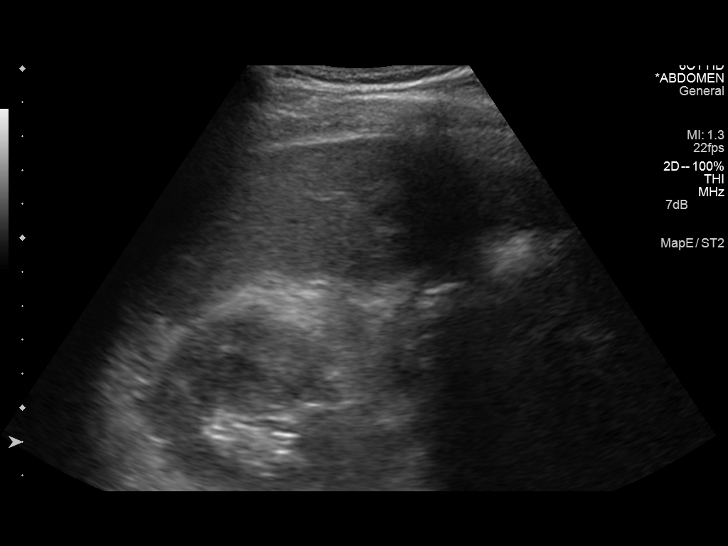
[im 74/89]
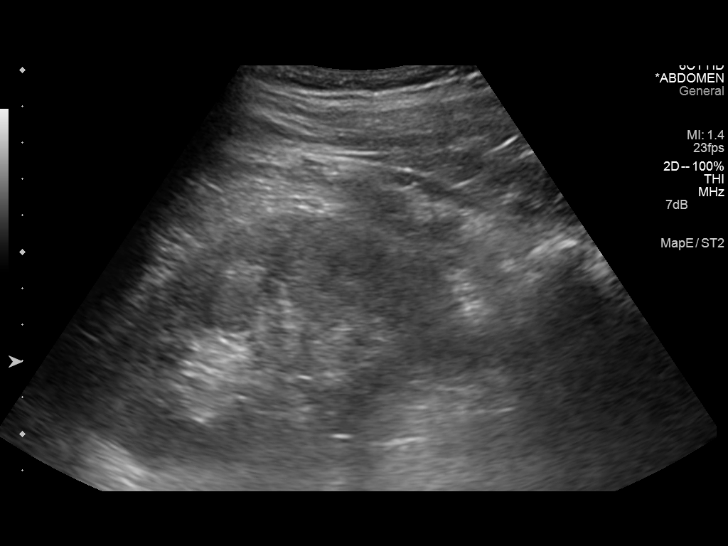
[im 81/89]
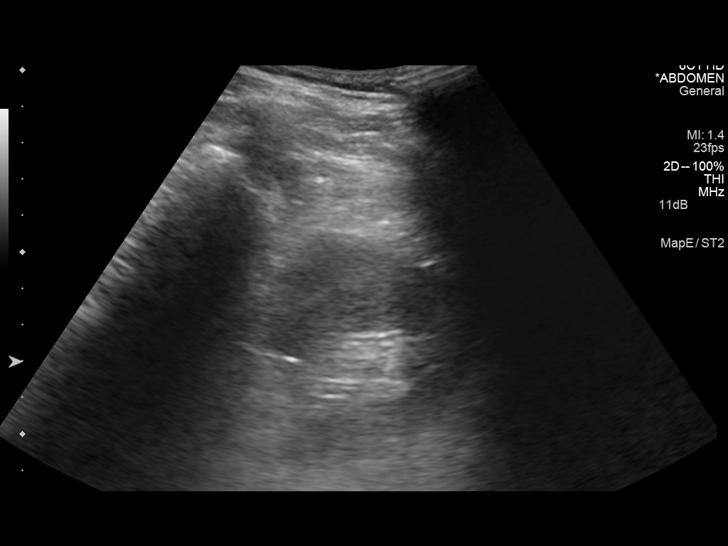
[im 89/89]
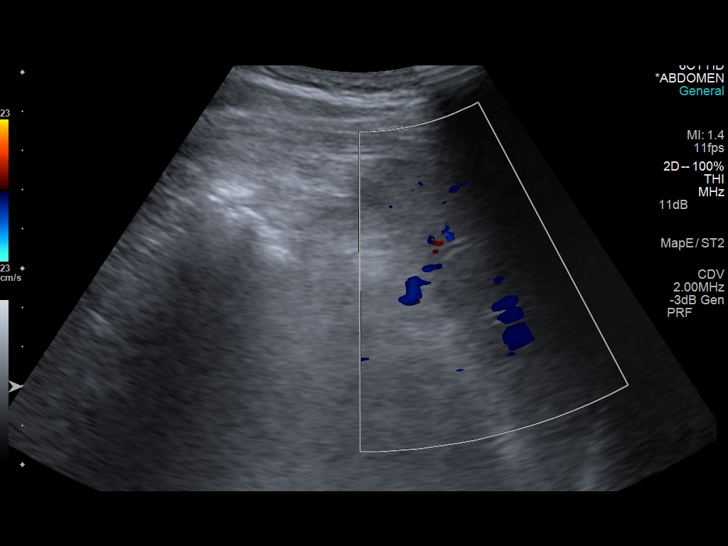

[14 of 25 positions shown; findings below may reference images not displayed]

FINDINGS: Gallbladder:

Gallstones are noted the gallbladder. No wall thickening visualized.
No sonographic Murphy sign noted.

Common bile duct:

Diameter: 4.5 mm

Liver:

No focal lesion identified. Within normal limits in parenchymal
echogenicity.

IVC:

No abnormality visualized.

Pancreas:

Visualized portion unremarkable.

Spleen:

Size and appearance within normal limits.

Right Kidney:

Length: 12.1 cm. Echogenicity within normal limits. No mass or
hydronephrosis visualized.

Left Kidney:

Length: 12.8 cm. Echogenicity within normal limits. No mass or
hydronephrosis visualized.

Abdominal aorta:

No aneurysm visualized.

Other findings:

None.
IMPRESSION: Cholelithiasis without sonographic evidence of acute cholecystitis.
No acute abnormality.

## 2016-11-22 ENCOUNTER — Ambulatory Visit (INDEPENDENT_AMBULATORY_CARE_PROVIDER_SITE_OTHER): Payer: Managed Care, Other (non HMO) | Admitting: Family Medicine

## 2016-11-22 ENCOUNTER — Encounter: Payer: Self-pay | Admitting: Family Medicine

## 2016-11-22 VITALS — BP 112/84 | HR 80 | Temp 98.7°F | Wt 176.0 lb

## 2016-11-22 DIAGNOSIS — L509 Urticaria, unspecified: Secondary | ICD-10-CM

## 2016-11-22 MED ORDER — FEXOFENADINE HCL 180 MG PO TABS: 180.0000 mg | ORAL_TABLET | Freq: Every day | ORAL | 1 refills | Status: DC

## 2016-11-22 MED ORDER — PREDNISONE 10 MG PO TABS: ORAL_TABLET | ORAL | 0 refills | Status: DC

## 2016-11-22 NOTE — Progress Notes (Signed)
Subjective:     Patient ID: Marcus [name], male   DOB: [birth year], 46 y.o.   MRN: [id number]  HPI  Marcus [name] is a 46 year old male who presents today with a rash.  Rash: Patient complains of rash involving the left arm, left leg, behind right ear and and on right side of face. Rash started 2 days ago. Appearance of rash at onset: Patient cannot state if it has changed but has noticed new areas appear. Initial distribution: arm and now behind leg.No discomfort associated. Associated symptoms: Pruritus. Denies: fever, chills sweats, myalgias, difficulty swallowing, hoarseness, SOB, N/V, abdominal pain, or tightening of throat.  No new exposures of soaps, lotions, laundry detergent, fabric softeners, foods, medications, plants, animals, or insects. Treatment at home includes cortisone cream and benedryl which has provided great benefit. He reports that this is similar to prior exposure to poison ivy however denies recent exposure.  Review of Systems  Constitutional: Negative for chills, fatigue and fever.  HENT: Negative for trouble swallowing.   Respiratory: Negative for cough, shortness of breath and wheezing.   Cardiovascular: Negative for chest pain and palpitations.  Gastrointestinal: Negative for abdominal pain, diarrhea, nausea and vomiting.  Musculoskeletal: Negative for myalgias.  Skin: Positive for rash.  Neurological: Negative for dizziness, light-headedness and numbness.    Past Medical History:  Diagnosis Date  . Asthma    as child only  . History of duodenal ulcer 2005   worse with stress  . Pancreatitis, gallstone 06/07/2014   S/p cholecystectomy, ERCP retrieval of intraductal stone (05/2014)   . Perirectal fistula      Social History   Social History  . Marital status: Married    Spouse name: N/A  . Number of children: N/A  . Years of education: N/A   Occupational History  . Not on file.   Social History Main Topics  . Smoking status: Never Smoker  . Smokeless  tobacco: Never Used  . Alcohol use Yes     Comment: Occasional  . Drug use: No  . Sexual activity: Not on file   Other Topics Concern  . Not on file   Social History Narrative   Spanish speaking, from [place]   Lives with wife and 2 sons and [place]   From [place], [place].     Occupation: works with Chief Executive Officer   Edu: some college Administrator)   Activity: 2x/wk - trail running   Diet: good water, fruits/vegetables daily    Past Surgical History:  Procedure Laterality Date  . CHOLECYSTECTOMY N/A 06/09/2014   with IOC, for gallstone pancreatitis, [name], MD  . ERCP N/A 06/10/2014   gallstone in pancreatic duct, Irene Shipper, MD  . EVALUATION UNDER ANESTHESIA WITH FISTULECTOMY N/A 08/18/2014   Procedure: internal hemorrhoidectomy, ligation of intershpinteric fistula tract (LIFT);  Surgeon: Michael Boston, MD  . TONSILLECTOMY    . WRIST FRACTURE SURGERY  2011   hardware (pins)    Family History  Problem Relation Age of Onset  . Diabetes Maternal Grandmother   . CAD Maternal Grandmother 61    MI  . Cancer Neg Hx   . Stroke Neg Hx     No Known Allergies  No current outpatient prescriptions on file prior to visit.   No current facility-administered medications on file prior to visit.     BP 112/84 (BP Location: Left Arm, Patient Position: Sitting, Cuff Size: Normal)   Pulse 80   Temp 98.7 F (37.1 C) (Oral)   Wt  176 lb (79.8 kg)   SpO2 99%   BMI 27.16 kg/m        Objective:   Physical Exam  Constitutional: He is oriented to person, place, and time. He appears well-developed and well-nourished.  Eyes: Pupils are equal, round, and reactive to light. No scleral icterus.  Neck: Neck supple.  Cardiovascular: Normal rate and regular rhythm.   Pulmonary/Chest: Effort normal and breath sounds normal. He has no wheezes. He has no rales.  Abdominal: Soft. Bowel sounds are normal. There is no tenderness.  Lymphadenopathy:    He has no cervical  adenopathy.  Neurological: He is alert and oriented to person, place, and time. Coordination normal.  Skin: Skin is warm and dry. Rash noted.  Circumscribed, raised, erythematous plaques that are oval in appearance noted on left arm, behind right ear and right side of face. Lesions are noted in the antecubital space of left arm and popliteal region of left leg. Minimal swelling of right upper eyelid noted. Lesions range from 1.5 cm to approximately 2.5 cm.   Psychiatric: He has a normal mood and affect. His behavior is normal. Judgment and thought content normal.       Assessment:     Urticaria: No known source identified    Plan:     1. Urticaria Symptoms appear to be allergic in nature. Advised use of fexofenadine, and short course of prednisone for comfort. Advised use of dry free/scent free products and detergents. Also recommended that he keep a diary of symptoms and foods/products used to help with determining source of potential allergy. Follow up recommended with provider if symptoms do not improve with treatment, and also advised seeking immediate medical treatment if he develops difficulty swallowing or feeling of tightness in his throat and chest. We discussed life threatening allergic reactions and importance of immediate assistance. - fexofenadine (ALLEGRA) 180 MG tablet; Take 1 tablet (180 mg total) by mouth daily.  Dispense: 30 tablet; Refill: 1 - predniSONE (DELTASONE) 10 MG tablet; Take 4 tablets daily for 4 days, 3 tabs daily for 2 days, 2 tabs daily for 2 days, and one tab daily for 2 days.  Dispense: 28 tablet; Refill: 0    Delano Metz, FNP-C

## 2016-11-22 NOTE — Patient Instructions (Addendum)
Please take prednisone as prescribed with food.  You have been prescribed fexofenadine that is prescribed once/daily. If needed you can take one tablet two times a day for itching however this may cause drowsiness.  Angioedema Angioedema is sudden swelling in the body. The swelling can happen in any part of the body. It often happens on the skin and causes itchy, bumpy patches (hives) to form. This condition may:  Happen only one time.  Happen more than one time. It may come back at random times.  Keep coming back for a number of years. Someday it may stop coming back. Follow these instructions at home:  Take over-the-counter and prescription medicines only as told by your doctor.  If you were given medicines for emergency allergy treatment, always carry them with you.  Wear a medical bracelet as told by your doctor.  Avoid the things that cause your attacks (triggers).  If this condition was passed to you from your parents and you want to have kids, talk to your doctor. Your kids may also have this condition. Contact a doctor if:  You have another attack.  Your attacks happen more often, even after you take steps to prevent them.  This condition was passed to you by your parents and you want to have kids. Get help right away if:  Your mouth, tongue, or lips get very swollen.  You have trouble breathing.  You have trouble swallowing.  You pass out (faint). This information is not intended to replace advice given to you by your health care provider. Make sure you discuss any questions you have with your health care provider. Document Released: 09/04/2009 Document Revised: 04/17/2016 Document Reviewed: 03/26/2016 Elsevier Interactive Patient Education  2017 [employer].

## 2016-12-02 ENCOUNTER — Encounter: Payer: BLUE CROSS/BLUE SHIELD | Admitting: Family Medicine

## 2016-12-17 ENCOUNTER — Ambulatory Visit (INDEPENDENT_AMBULATORY_CARE_PROVIDER_SITE_OTHER): Payer: Managed Care, Other (non HMO) | Admitting: Family Medicine

## 2016-12-17 ENCOUNTER — Encounter: Payer: Self-pay | Admitting: Family Medicine

## 2016-12-17 VITALS — BP 114/78 | HR 64 | Temp 98.2°F | Ht 67.5 in | Wt 176.8 lb

## 2016-12-17 DIAGNOSIS — K219 Gastro-esophageal reflux disease without esophagitis: Secondary | ICD-10-CM

## 2016-12-17 DIAGNOSIS — Z8719 Personal history of other diseases of the digestive system: Secondary | ICD-10-CM | POA: Diagnosis not present

## 2016-12-17 DIAGNOSIS — Z0000 Encounter for general adult medical examination without abnormal findings: Secondary | ICD-10-CM

## 2016-12-17 LAB — COMPREHENSIVE METABOLIC PANEL
ALBUMIN: 4.5 g/dL (ref 3.5–5.2)
ALT: 24 U/L (ref 0–53)
AST: 16 U/L (ref 0–37)
Alkaline Phosphatase: 61 U/L (ref 39–117)
BUN: 12 mg/dL (ref 6–23)
CALCIUM: 9.8 mg/dL (ref 8.4–10.5)
CHLORIDE: 103 meq/L (ref 96–112)
CO2: 28 mEq/L (ref 19–32)
Creatinine, Ser: 0.91 mg/dL (ref 0.40–1.50)
GFR: 96.05 mL/min (ref >60.00)
Glucose, Bld: 98 mg/dL (ref 70–99)
POTASSIUM: 4.3 meq/L (ref 3.5–5.1)
Sodium: 137 mEq/L (ref 135–145)
Total Bilirubin: 0.6 mg/dL (ref 0.2–1.2)
Total Protein: 7.5 g/dL (ref 6.0–8.3)

## 2016-12-17 NOTE — Assessment & Plan Note (Signed)
Diet controlled.  

## 2016-12-17 NOTE — Progress Notes (Signed)
Pre visit review using our clinic review tool, if applicable. No additional management support is needed unless otherwise documented below in the visit note. 

## 2016-12-17 NOTE — Assessment & Plan Note (Signed)
Preventative protocols reviewed and updated unless pt declined. Discussed healthy diet and lifestyle.  

## 2016-12-17 NOTE — Patient Instructions (Addendum)
Todo se ve bien y sano hoy. Regresar en 1 ao para proximo examen fisico.  Mantenimiento de la salud en los hombres (Health Maintenance, Male) Un estilo de vida saludable y los cuidados preventivos son importantes para la salud y Musician. Pregntele al mdico cul es el cronograma de exmenes peridicos adecuado para usted. Buda? Consuma una dieta saludable  Coma muchas verduras, frutas, cereales integrales, productos lcteos con bajo contenido de grasa y Advertising account planner.  No consuma muchos alimentos de alto contenido de grasas slidas, azcares agregados o sal. Mantenga un peso saludable La actividad fsica habitual puede ayudarlo a Science writer o mantener un peso saludable. Deber hacer lo siguiente:  Realizar al menos 130minutos de actividad fsica por semana. El ejercicio debe aumentar la frecuencia cardaca y Actor la transpiracin (ejercicio de [place]).  Hacer ejercicios de entrenamiento de fuerza por lo [employer] por semana. Controlarse los niveles de colesterol y lpidos en la sangre  Hgase anlisis de sangre para controlar los lpidos y el colesterol cada 5aos a partir de los 35aos. Si tiene un riesgo alto de Best boy cardiopatas coronarias, debe comenzar a [employer] de [place] a los [place]. Es posible que Automotive engineer los niveles de colesterol con mayor frecuencia si:  Sus niveles de lpidos y colesterol son altos.  Es mayor de 50aos.  Tiene un riesgo alto de tener cardiopatas coronarias. QU DEBO SABER SOBRE LAS PRUEBAS DE [place]? Muchos tipos de cncer se pueden detectar de manera temprana y a menudo prevenirse. Cncer de pulmn  Debe someterse a pruebas de deteccin de cncer de pulmn todos los aos en los siguientes casos:  Si fuma actualmente y lo ha hecho durante por lo menos 30aos.  Si fue fumador que dej el hbito en el trmino de los ltimos 15aos.  Hable con el  mdico sobre las opciones en relacin con los estudios de deteccin, cundo debe comenzar a Insurance underwriter y con Armed forces operational officer. Cncer colorrectal  Generalmente, las pruebas de deteccin habituales del cncer colorrectal comienzan a los 50aos y deben repetirse cada 5 a 10aos hasta los 75aos. Es posible que tenga que hacerse las pruebas con mayor frecuencia si se detectan formas tempranas de plipos precancerosos o pequeos bultos. Sin embargo, el mdico podr aconsejarle que lo haga antes, si tiene factores de riesgo para el cncer de colon.  El mdico puede recomendarle que use kits de prueba caseros para Publishing rights manager oculta en la materia fecal.  Se puede usar una pequea cmara en el extremo de un tubo para examinar el colon (sigmoidoscopia o colonoscopia). Este estudio [employer] formas ms tempranas de Surveyor, minerals. Cncer de prstata y de testculo  En funcin de la edad y del [place] de salud general, el mdico puede realizarle determinados estudios de deteccin del cncer de prstata y de testculo.  Hable con el mdico sobre cualquier sntoma o acerca de las inquietudes que tenga sobre el cncer de prstata o de testculo. Cncer de piel  Revise la piel de la cabeza a los pies con regularidad.  Informe al mdico si aparecen nuevos lunares o si nota cambios en los que ya tiene, especialmente en estos casos:  Si hay un cambio en el tamao, la forma o el color del lunar.  Si tiene un lunar que es ms grande que el tamao de una goma de Games developer.  Siempre use pantalla solar. Aplquese pantalla solar de manera generosa y repetida a lo Maryln Gottron  del da.  Use mangas y [employer], un sombrero de ala ancha y gafas para el sol cuando est al Alexis Goodell, para protegerse. QU DEBO SABER SOBRE LAS CARDIOPATAS CORONARIAS, LA DIABETES Y LA HIPERTENSIN ARTERIAL?  Si usted tiene entre 18 y 47aos, debe medirse la presin arterial cada 3a 5aos. Si usted tiene 40aos o ms, debe  medirse la presin arterial [employer]. Debe medirse la presin arterial dos veces: una vez cuando est en un hospital o una clnica y la otra vez cuando est en otro sitio. Registre el promedio de [employer]. Para controlar su presin arterial cuando no est en un hospital o [place], puede usar lo siguiente:  Ardelia Mems mquina automtica para medir la presin arterial en una farmacia.  Un monitor para medir la presin arterial en el hogar.  Hable con el mdico [employer] ideales de la presin arterial.  Si tiene entre 54 y 79aos, consltele al mdico si debe tomar aspirina para evitar las cardiopatas coronarias.  Hgase anlisis habituales de deteccin de la diabetes; para ello, contrlese la glucemia en ayunas.  Si su peso es normal y tiene un bajo riesgo de padecer diabetes, realcese este anlisis cada tres aos despus de los 45aos.  Si tiene sobrepeso y un alto riesgo de padecer diabetes, considere someterse a este anlisis antes o con mayor frecuencia.  Para los hombres que tienen entre 22 y 24aos, y son o han sido fumadores, se recomienda un nico estudio con ecografa para Hydrographic surveyor un aneurisma de aorta abdominal (AAA). QU DEBO SABER SOBRE LA PREVENCIN DE LAS INFECCIONES? HepatitisB Si tiene un riesgo ms alto de Museum/gallery curator hepatitis B, debe someterse a un examen de deteccin de este virus. Hable con el mdico para determinar si corre riesgo de tener una infeccin por hepatitisB. Hepatitis C Se recomienda un anlisis de [place] para:  Todos los que nacieron entre 1945 y [phone].  Todas las personas que tengan un riesgo de haber contrado hepatitis C. Enfermedades de transmisin sexual (ETS)  Debe realizarse pruebas de Programme researcher, broadcasting/film/video de las ETS todos los aos, incluidas la gonorrea y la clamidia, en estos casos:  Es sexualmente activo y es menor de [place].  Es mayor de 24aos, y Investment banker, operational informa que corre riesgo de tener este tipo de infecciones.  La  actividad sexual ha cambiado desde que le hicieron la ltima prueba de deteccin y tiene un riesgo mayor de Best boy clamidia o Radio broadcast assistant. Pregntele al mdico si usted tiene riesgo.  Consulte a su mdico para saber si tiene un alto riesgo de infectarse por el VIH. El mdico puede recomendarle un medicamento de venta con receta para ayudar a evitar la infeccin por el VIH. QU MS PUEDO HACER?  Realcese los estudios de rutina de la salud, dentales y de Public librarian.  [place] (inmunizaciones).  No consuma ningn producto que contenga tabaco, lo que incluye cigarrillos, tabaco de Higher education careers adviser y Psychologist, sport and exercise. Si necesita ayuda para dejar de fumar, consulte al mdico.  Limite el consumo de alcohol a no ms de 51medidas por da. [employer] a 12 onzas de cerveza, 5onzas de vino o 1onzas de bebidas alcohlicas de alta graduacin.  No consuma drogas.  No comparta agujas.  Solicite ayuda a su mdico si necesita apoyo o informacin para abandonar las drogas.  Informe a su mdico si a menudo se siente deprimido.  Notifique a su mdico si alguna vez ha sido vctima de abuso o  si no se siente seguro en su hogar. Esta informacin no tiene Marine scientist el consejo del mdico. Asegrese de hacerle al mdico cualquier pregunta que tenga. Document Released: 03/14/2008 Document Revised: 10/07/2014 Document Reviewed: 06/20/2015 Elsevier Interactive Patient Education  2017 [employer].

## 2016-12-17 NOTE — Progress Notes (Signed)
BP 114/78   Pulse 64   Temp 98.2 F (36.8 C) (Oral)   Ht 5' 7.5" (1.715 m)   Wt 176 lb 12 oz (80.2 kg)   BMI 27.27 kg/m    CC: CPE Subjective:    Patient ID: Marcus [name], male    DOB: 05-18-1971, 46 y.o.   MRN: [id number]  HPI: Marcus [name] is a 46 y.o. male presenting on 12/17/2016 for Annual Exam   Recent allergic urticaria flare, note reviewed.   2015 - gallbladder pancreatitis s/p cholecystectomy.  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 Denies changing moles on skin. Non smoker Alcohol - occasional on weekends.  Spanish speaking, from [place]  Lives with wife and 2 sons and [place]  [place], [place]. Occupation: works with Chief Executive Officer  Edu: some college Administrator)  Activity: 2x/wk - trail running  Diet: good water, fruits/vegetables daily   Relevant past medical, surgical, family and social history reviewed and updated as indicated. Interim medical history since our last visit reviewed. Allergies and medications reviewed and updated. Outpatient Medications Prior to Visit  Medication Sig Dispense Refill  . fexofenadine (ALLEGRA) 180 MG tablet Take 1 tablet (180 mg total) by mouth daily. 30 tablet 1  . predniSONE (DELTASONE) 10 MG tablet Take 4 tablets daily for 4 days, 3 tabs daily for 2 days, 2 tabs daily for 2 days, and one tab daily for 2 days. 28 tablet 0   No facility-administered medications prior to visit.      Per HPI unless specifically indicated in ROS section below Review of Systems  Constitutional: Negative for activity change, appetite change, chills, fatigue, fever and unexpected weight change.  HENT: Negative for hearing loss.   Eyes: Negative for visual disturbance.  Respiratory: Negative for cough, chest tightness, shortness of breath and wheezing.   Cardiovascular:  Negative for chest pain, palpitations and leg swelling.  Gastrointestinal: Negative for abdominal distention, abdominal pain, blood in stool, constipation, diarrhea, nausea and vomiting.  Genitourinary: Negative for difficulty urinating and hematuria.  Musculoskeletal: Negative for arthralgias, myalgias and neck pain.  Skin: Negative for rash.  Neurological: Negative for dizziness, seizures, syncope and headaches.  Hematological: Negative for adenopathy. Does not bruise/bleed easily.  Psychiatric/Behavioral: Negative for dysphoric mood. The patient is not nervous/anxious.        Objective:    BP 114/78   Pulse 64   Temp 98.2 F (36.8 C) (Oral)   Ht 5' 7.5" (1.715 m)   Wt 176 lb 12 oz (80.2 kg)   BMI 27.27 kg/m   Wt Readings from Last 3 Encounters:  12/17/16 176 lb 12 oz (80.2 kg)  11/22/16 176 lb (79.8 kg)  11/29/15 171 lb 4 oz (77.7 kg)    Physical Exam  Constitutional: He is oriented to person, place, and time. He appears well-developed and well-nourished. No distress.  HENT:  Head: Normocephalic and atraumatic.  Right Ear: Hearing, tympanic membrane, external ear and ear canal normal.  Left Ear: Hearing, tympanic membrane, external ear and ear canal normal.  Nose: Nose normal.  Mouth/Throat: Uvula is midline, oropharynx is clear and moist and mucous membranes are normal. No oropharyngeal exudate, posterior oropharyngeal edema or posterior oropharyngeal erythema.  Eyes: Conjunctivae and EOM are normal. Pupils are equal, round, and reactive to light. No scleral icterus.  Neck: Normal range of motion. Neck supple. No thyromegaly present.  Cardiovascular:  Normal rate, regular rhythm, normal heart sounds and intact distal pulses.   No murmur heard. Pulses:      Radial pulses are 2+ on the right side, and 2+ on the left side.  Pulmonary/Chest: Effort normal and breath sounds normal. No respiratory distress. He has no wheezes. He has no rales.  Abdominal: Soft. Bowel sounds are  normal. He exhibits no distension and no mass. There is no tenderness. There is no rebound and no guarding.  Musculoskeletal: Normal range of motion. He exhibits no edema.  Lymphadenopathy:    He has no cervical adenopathy.  Neurological: He is alert and oriented to person, place, and time.  CN grossly intact, station and gait intact  Skin: Skin is warm and dry. No rash noted.  Psychiatric: He has a normal mood and affect. His behavior is normal. Judgment and thought content normal.  Nursing note and vitals reviewed.  Results for orders placed or performed in visit on 11/29/15  Lipid panel  Result Value Ref Range   Cholesterol 171 0 - 200 mg/dL   Triglycerides 56.0 0.0 - 149.0 mg/dL   HDL 45.10 >39.00 mg/dL   VLDL 11.2 0.0 - 40.0 mg/dL   LDL Cholesterol 115 (H) 0 - 99 mg/dL   Total CHOL/HDL Ratio 4    NonHDL 470.92   Basic metabolic panel  Result Value Ref Range   Sodium 139 135 - 145 mEq/L   Potassium 4.2 3.5 - 5.1 mEq/L   Chloride 106 96 - 112 mEq/L   CO2 27 19 - 32 mEq/L   Glucose, Bld 89 70 - 99 mg/dL   BUN 12 6 - 23 mg/dL   Creatinine, Ser 0.89 0.40 - 1.50 mg/dL   Calcium 9.2 8.4 - 10.5 mg/dL   GFR 99.02 >60.00 mL/min      Assessment & Plan:   Problem List Items Addressed This Visit    GERD (gastroesophageal reflux disease)    Diet controlled.      Health maintenance examination - Primary    Preventative protocols reviewed and updated unless pt declined. Discussed healthy diet and lifestyle.        Other Visit Diagnoses    History of pancreatitis       Relevant Orders   Comprehensive metabolic panel       Follow up plan: Return in about 1 year (around 12/17/2017) for annual exam, prior fasting for blood work.  Ria Bush, MD

## 2016-12-18 ENCOUNTER — Encounter: Payer: Self-pay | Admitting: *Deleted

## 2017-12-14 ENCOUNTER — Other Ambulatory Visit: Payer: Self-pay | Admitting: Family Medicine

## 2017-12-14 DIAGNOSIS — Z131 Encounter for screening for diabetes mellitus: Secondary | ICD-10-CM

## 2017-12-14 DIAGNOSIS — Z13220 Encounter for screening for lipoid disorders: Secondary | ICD-10-CM

## 2017-12-17 ENCOUNTER — Other Ambulatory Visit (INDEPENDENT_AMBULATORY_CARE_PROVIDER_SITE_OTHER): Payer: Managed Care, Other (non HMO)

## 2017-12-17 DIAGNOSIS — Z131 Encounter for screening for diabetes mellitus: Secondary | ICD-10-CM

## 2017-12-17 DIAGNOSIS — Z13220 Encounter for screening for lipoid disorders: Secondary | ICD-10-CM

## 2017-12-17 LAB — LIPID PANEL
CHOL/HDL RATIO: 4
CHOLESTEROL: 169 mg/dL (ref 0–200)
HDL: 46.6 mg/dL (ref >39.00)
LDL Cholesterol: 108 mg/dL — ABNORMAL HIGH (ref 0–99)
NonHDL: 122.69
Triglycerides: 72 mg/dL (ref 0.0–149.0)
VLDL: 14.4 mg/dL (ref 0.0–40.0)

## 2017-12-17 LAB — BASIC METABOLIC PANEL
BUN: 13 mg/dL (ref 6–23)
CO2: 24 mEq/L (ref 19–32)
CREATININE: 0.94 mg/dL (ref 0.40–1.50)
Calcium: 9.2 mg/dL (ref 8.4–10.5)
Chloride: 110 mEq/L (ref 96–112)
GFR: 92.1 mL/min (ref >60.00)
Glucose, Bld: 103 mg/dL — ABNORMAL HIGH (ref 70–99)
POTASSIUM: 4.3 meq/L (ref 3.5–5.1)
Sodium: 143 mEq/L (ref 135–145)

## 2017-12-19 ENCOUNTER — Ambulatory Visit (INDEPENDENT_AMBULATORY_CARE_PROVIDER_SITE_OTHER): Payer: Managed Care, Other (non HMO) | Admitting: Family Medicine

## 2017-12-19 ENCOUNTER — Encounter: Payer: Self-pay | Admitting: Family Medicine

## 2017-12-19 VITALS — BP 118/80 | HR 61 | Temp 97.9°F | Ht 67.0 in | Wt 173.0 lb

## 2017-12-19 DIAGNOSIS — Z0000 Encounter for general adult medical examination without abnormal findings: Secondary | ICD-10-CM | POA: Diagnosis not present

## 2017-12-19 NOTE — Assessment & Plan Note (Signed)
Preventative protocols reviewed and updated unless pt declined. Discussed healthy diet and lifestyle.  

## 2017-12-19 NOTE — Patient Instructions (Addendum)
Gusto verlo hoy. Limitar un poco la Museum/gallery conservator.  Regresar en 12-18 meses para proximo examen fisico.  Health Maintenance, Male A healthy lifestyle and preventive care is important for your health and wellness. Ask your health care provider about what schedule of regular examinations is right for you. What should I know about weight and diet? Eat a Healthy Diet  Eat plenty of vegetables, fruits, whole grains, low-fat dairy products, and lean protein.  Do not eat a lot of foods high in solid fats, added sugars, or salt.  Maintain a Healthy Weight Regular exercise can help you achieve or maintain a healthy weight. You should:  Do at least 150 minutes of exercise each week. The exercise should increase your heart rate and make you sweat (moderate-intensity exercise).  Do strength-training exercises at least twice a week.  Watch Your Levels of Cholesterol and Blood Lipids  Have your blood tested for lipids and cholesterol every 5 years starting at 46 years of age. If you are at high risk for heart disease, you should start having your blood tested when you are 46 years old. You may need to have your cholesterol levels checked more often if: ? Your lipid or cholesterol levels are high. ? You are older than 46 years of age. ? You are at high risk for heart disease.  What should I know about cancer screening? Many types of cancers can be detected early and may often be prevented. Lung Cancer  You should be screened every year for lung cancer if: ? You are a current smoker who has smoked for at least 30 years. ? You are a former smoker who has quit within the past 15 years.  Talk to your health care provider about your screening options, when you should start screening, and how often you should be screened.  Colorectal Cancer  Routine colorectal cancer screening usually begins at 46 years of age and should be repeated every 5-10 years until you are 47 years old. You may need to  be screened more often if early forms of precancerous polyps or small growths are found. Your health care provider may recommend screening at an earlier age if you have risk factors for colon cancer.  Your health care provider may recommend using home test kits to check for hidden blood in the stool.  A small camera at the end of a tube can be used to examine your colon (sigmoidoscopy or colonoscopy). This checks for the earliest forms of colorectal cancer.  Prostate and Testicular Cancer  Depending on your age and overall health, your health care provider may do certain tests to screen for prostate and testicular cancer.  Talk to your health care provider about any symptoms or concerns you have about testicular or prostate cancer.  Skin Cancer  Check your skin from head to toe regularly.  Tell your health care provider about any new moles or changes in moles, especially if: ? There is a change in a mole's size, shape, or color. ? You have a mole that is larger than a pencil eraser.  Always use sunscreen. Apply sunscreen liberally and repeat throughout the day.  Protect yourself by wearing long sleeves, pants, a wide-brimmed hat, and sunglasses when outside.  What should I know about heart disease, diabetes, and high blood pressure?  If you are 47-66 years of age, have your blood pressure checked every 3-5 years. If you are 73 years of age or older, have your blood pressure checked  every year. You should have your blood pressure measured twice-once when you are at a hospital or clinic, and once when you are not at a hospital or clinic. Record the average of the two measurements. To check your blood pressure when you are not at a hospital or clinic, you can use: ? An automated blood pressure machine at a pharmacy. ? A home blood pressure monitor.  Talk to your health care provider about your target blood pressure.  If you are between 93-77 years old, ask your health care provider if  you should take aspirin to prevent heart disease.  Have regular diabetes screenings by checking your fasting blood sugar level. ? If you are at a normal weight and have a low risk for diabetes, have this test once every three years after the age of 43. ? If you are overweight and have a high risk for diabetes, consider being tested at a younger age or more often.  A one-time screening for abdominal aortic aneurysm (AAA) by ultrasound is recommended for men aged 40-75 years who are current or former smokers. What should I know about preventing infection? Hepatitis B If you have a higher risk for hepatitis B, you should be screened for this virus. Talk with your health care provider to find out if you are at risk for hepatitis B infection. Hepatitis C Blood testing is recommended for:  Everyone born from [birth year] through 1964.  Anyone with known risk factors for hepatitis C.  Sexually Transmitted Diseases (STDs)  You should be screened each year for STDs including gonorrhea and chlamydia if: ? You are sexually active and are younger than 46 years of age. ? You are older than 46 years of age and your health care provider tells you that you are at risk for this type of infection. ? Your sexual activity has changed since you were last screened and you are at an increased risk for chlamydia or gonorrhea. Ask your health care provider if you are at risk.  Talk with your health care provider about whether you are at high risk of being infected with HIV. Your health care provider may recommend a prescription medicine to help prevent HIV infection.  What else can I do?  Schedule regular health, dental, and eye exams.  Stay current with your vaccines (immunizations).  Do not use any tobacco products, such as cigarettes, chewing tobacco, and e-cigarettes. If you need help quitting, ask your health care provider.  Limit alcohol intake to no more than 2 drinks per day. One drink equals 12 ounces of  beer, 5 ounces of wine, or 1 ounces of hard liquor.  Do not use street drugs.  Do not share needles.  Ask your health care provider for help if you need support or information about quitting drugs.  Tell your health care provider if you often feel depressed.  Tell your health care provider if you have ever been abused or do not feel safe at home. This information is not intended to replace advice given to you by your health care provider. Make sure you discuss any questions you have with your health care provider. Document Released: 03/14/2008 Document Revised: 05/15/2016 Document Reviewed: 06/20/2015 Elsevier Interactive Patient Education  [employer].

## 2017-12-19 NOTE — Progress Notes (Signed)
BP 118/80 (BP Location: Left Arm, Patient Position: Sitting, Cuff Size: Normal)   Pulse 61   Temp 97.9 F (36.6 C) (Oral)   Ht 5\' 7"  (1.702 m)   Wt 173 lb (78.5 kg)   SpO2 98%   BMI 27.10 kg/m    CC: CPE Subjective:    Patient ID: Marcus [name], male    DOB: 05-18-1971, 47 y.o.   MRN: [id number]  HPI: Marcus [name] is a 47 y.o. male presenting on 12/19/2017 for Annual Exam   Preventative: Flu shot - yearly Td 2010 Seat belt use discussed Sunscreen use discussed. Denies changing moles on skin.  Non smoker Alcohol - occasional on weekends.  Spanish speaking, from [place]  Lives with wife and 2 sons and [place]  From [place], [place]. Occupation: works with [employer]  Edu: some college Administrator)  Activity: 3x/wk - trail running  Diet: good water, fruits/vegetables daily   Relevant past medical, surgical, family and social history reviewed and updated as indicated. Interim medical history since our last visit reviewed. Allergies and medications reviewed and updated. No outpatient medications prior to visit.   No facility-administered medications prior to visit.      Per HPI unless specifically indicated in ROS section below Review of Systems  Constitutional: Negative for activity change, appetite change, chills, fatigue, fever and unexpected weight change.  HENT: Negative for hearing loss.   Eyes: Negative for visual disturbance.  Respiratory: Negative for cough, chest tightness, shortness of breath and wheezing.   Cardiovascular: Negative for chest pain, palpitations and leg swelling.  Gastrointestinal: Negative for abdominal distention, abdominal pain, blood in stool, constipation, diarrhea, nausea and vomiting.  Genitourinary: Negative for difficulty urinating and hematuria.  Musculoskeletal: Negative for arthralgias, myalgias and neck pain.  Skin: Negative for rash.  Neurological: Negative for dizziness, seizures, syncope and headaches.    Hematological: Negative for adenopathy. Does not bruise/bleed easily.  Psychiatric/Behavioral: Negative for dysphoric mood. The patient is nervous/anxious (work related stress - reviewed).        Objective:    BP 118/80 (BP Location: Left Arm, Patient Position: Sitting, Cuff Size: Normal)   Pulse 61   Temp 97.9 F (36.6 C) (Oral)   Ht 5\' 7"  (1.702 m)   Wt 173 lb (78.5 kg)   SpO2 98%   BMI 27.10 kg/m   Wt Readings from Last 3 Encounters:  12/19/17 173 lb (78.5 kg)  12/17/16 176 lb 12 oz (80.2 kg)  11/22/16 176 lb (79.8 kg)    Physical Exam  Constitutional: He is oriented to person, place, and time. He appears well-developed and well-nourished. No distress.  HENT:  Head: Normocephalic and atraumatic.  Right Ear: Hearing, tympanic membrane, external ear and ear canal normal.  Left Ear: Hearing, tympanic membrane, external ear and ear canal normal.  Nose: Nose normal.  Mouth/Throat: Uvula is midline, oropharynx is clear and moist and mucous membranes are normal. No oropharyngeal exudate, posterior oropharyngeal edema or posterior oropharyngeal erythema.  Eyes: Pupils are equal, round, and reactive to light. Conjunctivae and EOM are normal. No scleral icterus.  Neck: Normal range of motion. Neck supple. No thyromegaly present.  Cardiovascular: Normal rate, regular rhythm, normal heart sounds and intact distal pulses.  No murmur heard. Pulses:      Radial pulses are 2+ on the right side, and 2+ on the left side.  Pulmonary/Chest: Effort normal and breath sounds normal. No respiratory distress. He has no wheezes. He has no rales.  Abdominal: Soft. Bowel sounds are  normal. He exhibits no distension and no mass. There is no tenderness. There is no rebound and no guarding.  Musculoskeletal: Normal range of motion. He exhibits no edema.  Lymphadenopathy:    He has no cervical adenopathy.  Neurological: He is alert and oriented to person, place, and time.  CN grossly intact, station  and gait intact  Skin: Skin is warm and dry. No rash noted.  Psychiatric: He has a normal mood and affect. His behavior is normal. Judgment and thought content normal.  Nursing note and vitals reviewed.  Results for orders placed or performed in visit on 00/92/33  Basic metabolic panel  Result Value Ref Range   Sodium 143 135 - 145 mEq/L   Potassium 4.3 3.5 - 5.1 mEq/L   Chloride 110 96 - 112 mEq/L   CO2 24 19 - 32 mEq/L   Glucose, Bld 103 (H) 70 - 99 mg/dL   BUN 13 6 - 23 mg/dL   Creatinine, Ser 0.94 0.40 - 1.50 mg/dL   Calcium 9.2 8.4 - 10.5 mg/dL   GFR 92.10 >60.00 mL/min  Lipid panel  Result Value Ref Range   Cholesterol 169 0 - 200 mg/dL   Triglycerides 72.0 0.0 - 149.0 mg/dL   HDL 46.60 >39.00 mg/dL   VLDL 14.4 0.0 - 40.0 mg/dL   LDL Cholesterol 108 (H) 0 - 99 mg/dL   Total CHOL/HDL Ratio 4    NonHDL 122.69       Assessment & Plan:   Problem List Items Addressed This Visit    Health maintenance examination - Primary    Preventative protocols reviewed and updated unless pt declined. Discussed healthy diet and lifestyle.           No orders of the defined types were placed in this encounter.  No orders of the defined types were placed in this encounter.   Follow up plan: Return in about 1 year (around 12/20/2018) for follow up visit.  Marcus Bush, MD

## 2018-07-03 ENCOUNTER — Encounter: Payer: Self-pay | Admitting: Family Medicine

## 2018-07-03 ENCOUNTER — Ambulatory Visit (INDEPENDENT_AMBULATORY_CARE_PROVIDER_SITE_OTHER): Payer: Managed Care, Other (non HMO) | Admitting: Family Medicine

## 2018-07-03 ENCOUNTER — Encounter (INDEPENDENT_AMBULATORY_CARE_PROVIDER_SITE_OTHER): Payer: Self-pay

## 2018-07-03 VITALS — BP 116/70 | HR 65 | Temp 98.0°F | Ht 67.0 in | Wt 169.0 lb

## 2018-07-03 DIAGNOSIS — K219 Gastro-esophageal reflux disease without esophagitis: Secondary | ICD-10-CM

## 2018-07-03 DIAGNOSIS — Z23 Encounter for immunization: Secondary | ICD-10-CM | POA: Diagnosis not present

## 2018-07-03 DIAGNOSIS — R196 Halitosis: Secondary | ICD-10-CM | POA: Insufficient documentation

## 2018-07-03 MED ORDER — OMEPRAZOLE 20 MG PO CPDR: 20.0000 mg | DELAYED_RELEASE_CAPSULE | Freq: Every day | ORAL | 1 refills | Status: DC

## 2018-07-03 NOTE — Assessment & Plan Note (Signed)
With endorsed dry mouth and bad taste in mouth although exam by dentist normal. On history and exam today also no cause found. He does endorse some GERD (and h/o duodenal ulcer) - will Rx omeprazole 20mg  daily for 3 wks. If no improvement noted, discussed possible ENT referral.

## 2018-07-03 NOTE — Assessment & Plan Note (Signed)
Intermittent, diet controlled. See above. Will Rx omeprazole 20mg  daily x 3 wks then update with effect.

## 2018-07-03 NOTE — Patient Instructions (Addendum)
Flu shot today Posible efecto de reflujo o acidez - tome medicamento contra acidez omeprazole 20mg  diarios 30 minutos antes de comida grande o diario. Tome por 3 semanas seguido y si no ayuda, me avisa para mandarlo a Engineering geologist (ENT).

## 2018-07-03 NOTE — Progress Notes (Signed)
BP 116/70 (BP Location: Left Arm, Patient Position: Sitting, Cuff Size: Normal)   Pulse 65   Temp 98 F (36.7 C) (Oral)   Ht 5\' 7"  (1.702 m)   Wt 169 lb (76.7 kg)   SpO2 98%   BMI 26.47 kg/m    CC: bad taste in mouth Subjective:    Patient ID: [name] Seen, male    DOB: 1971/04/20, 47 y.o.   MRN: [id number]  HPI: Dandy Lazaro is a 47 y.o. male presenting on 07/03/2018 for Mouth Problem (C/o bad taste in his mouth for months and dry mouth. Seen by dentist, told he has good hygiene. Suggested pt see PCP. )   Several month h/o bad taste in mouth and dry mouth, as well as halitosis. Good dental hygiene. Saw dentist - good dental hygiene. He uses listerine regularly, as well as gum/mints. Feels he stays well hydrated. Occasional GERD/heartburn symptoms. Wife has noted bad breath.   Denies fevers/chills, HA, sinus congestion, cough, no metallic taste, no eye dryness. No dysphagia, abd pain, nausea/vomiting. No PNDrainage. No enlarged glands of lymph nodes, no arthralgias no new rashes. No tonsilloliths.   Not on med, vitamin or supplement.  No alcohol. No smoking. No smokeless tobacco.   S/p tonsillectomy as a child  Relevant past medical, surgical, family and social history reviewed and updated as indicated. Interim medical history since our last visit reviewed. Allergies and medications reviewed and updated. No outpatient medications prior to visit.   No facility-administered medications prior to visit.      Per HPI unless specifically indicated in ROS section below Review of Systems     Objective:    BP 116/70 (BP Location: Left Arm, Patient Position: Sitting, Cuff Size: Normal)   Pulse 65   Temp 98 F (36.7 C) (Oral)   Ht 5\' 7"  (1.702 m)   Wt 169 lb (76.7 kg)   SpO2 98%   BMI 26.47 kg/m   Wt Readings from Last 3 Encounters:  07/03/18 169 lb (76.7 kg)  12/19/17 173 lb (78.5 kg)  12/17/16 176 lb 12 oz (80.2 kg)    Physical Exam  Constitutional: He appears  well-developed and well-nourished. No distress.  HENT:  Head: Normocephalic and atraumatic.  Right Ear: Hearing, tympanic membrane, external ear and ear canal normal.  Left Ear: Hearing, tympanic membrane, external ear and ear canal normal.  Nose: Nose normal. No mucosal edema or rhinorrhea. Right sinus exhibits no maxillary sinus tenderness and no frontal sinus tenderness. Left sinus exhibits no maxillary sinus tenderness and no frontal sinus tenderness.  Mouth/Throat: Uvula is midline, oropharynx is clear and moist and mucous membranes are normal. No oropharyngeal exudate, posterior oropharyngeal edema, posterior oropharyngeal erythema or tonsillar abscesses.  Eyes: Pupils are equal, round, and reactive to light. Conjunctivae and EOM are normal. No scleral icterus.  Neck: Normal range of motion. Neck supple. No thyromegaly present.  No parotid or salivary gland masses or swelling  Cardiovascular: Normal rate, regular rhythm, normal heart sounds and intact distal pulses.  No murmur heard. Pulmonary/Chest: Effort normal and breath sounds normal. No respiratory distress. He has no wheezes. He has no rales.  Abdominal: Soft. He exhibits no mass. There is no hepatosplenomegaly. There is no tenderness. There is no guarding and no CVA tenderness. No hernia.  Musculoskeletal: He exhibits no edema.  Lymphadenopathy:    He has no cervical adenopathy.  Skin: Skin is warm and dry. No rash noted.  Nursing note and vitals reviewed.  Results for orders  placed or performed in visit on 92/42/68  Basic metabolic panel  Result Value Ref Range   Sodium 143 135 - 145 mEq/L   Potassium 4.3 3.5 - 5.1 mEq/L   Chloride 110 96 - 112 mEq/L   CO2 24 19 - 32 mEq/L   Glucose, Bld 103 (H) 70 - 99 mg/dL   BUN 13 6 - 23 mg/dL   Creatinine, Ser 0.94 0.40 - 1.50 mg/dL   Calcium 9.2 8.4 - 10.5 mg/dL   GFR 92.10 >60.00 mL/min  Lipid panel  Result Value Ref Range   Cholesterol 169 0 - 200 mg/dL   Triglycerides 72.0  0.0 - 149.0 mg/dL   HDL 46.60 >39.00 mg/dL   VLDL 14.4 0.0 - 40.0 mg/dL   LDL Cholesterol 108 (H) 0 - 99 mg/dL   Total CHOL/HDL Ratio 4    NonHDL 122.69       Assessment & Plan:   Problem List Items Addressed This Visit    Halitosis - Primary    With endorsed dry mouth and bad taste in mouth although exam by dentist normal. On history and exam today also no cause found. He does endorse some GERD (and h/o duodenal ulcer) - will Rx omeprazole 20mg  daily for 3 wks. If no improvement noted, discussed possible ENT referral.       GERD (gastroesophageal reflux disease)    Intermittent, diet controlled. See above. Will Rx omeprazole 20mg  daily x 3 wks then update with effect.       Relevant Medications   omeprazole (PRILOSEC) 20 MG capsule    Other Visit Diagnoses    Need for influenza vaccination       Relevant Orders   Flu Vaccine QUAD 36+ mos IM (Completed)       Meds ordered this encounter  Medications  . omeprazole (PRILOSEC) 20 MG capsule    Sig: Take 1 capsule (20 mg total) by mouth daily.    Dispense:  30 capsule    Refill:  1   Orders Placed This Encounter  Procedures  . Flu Vaccine QUAD 36+ mos IM    Follow up plan: No follow-ups on file.  Ria Bush, MD

## 2018-12-13 ENCOUNTER — Other Ambulatory Visit: Payer: Self-pay | Admitting: Family Medicine

## 2018-12-13 ENCOUNTER — Encounter: Payer: Self-pay | Admitting: Family Medicine

## 2018-12-13 DIAGNOSIS — R739 Hyperglycemia, unspecified: Secondary | ICD-10-CM

## 2018-12-13 DIAGNOSIS — Z13220 Encounter for screening for lipoid disorders: Secondary | ICD-10-CM

## 2018-12-18 ENCOUNTER — Other Ambulatory Visit (INDEPENDENT_AMBULATORY_CARE_PROVIDER_SITE_OTHER): Payer: Managed Care, Other (non HMO)

## 2018-12-18 ENCOUNTER — Telehealth: Payer: Self-pay | Admitting: Family Medicine

## 2018-12-18 ENCOUNTER — Other Ambulatory Visit: Payer: Self-pay

## 2018-12-18 DIAGNOSIS — R739 Hyperglycemia, unspecified: Secondary | ICD-10-CM | POA: Diagnosis not present

## 2018-12-18 DIAGNOSIS — Z13220 Encounter for screening for lipoid disorders: Secondary | ICD-10-CM | POA: Diagnosis not present

## 2018-12-18 LAB — COMPREHENSIVE METABOLIC PANEL
ALK PHOS: 60 U/L (ref 39–117)
ALT: 23 U/L (ref 0–53)
AST: 16 U/L (ref 0–37)
Albumin: 4.4 g/dL (ref 3.5–5.2)
BUN: 13 mg/dL (ref 6–23)
CHLORIDE: 105 meq/L (ref 96–112)
CO2: 28 meq/L (ref 19–32)
Calcium: 9 mg/dL (ref 8.4–10.5)
Creatinine, Ser: 0.97 mg/dL (ref 0.40–1.50)
GFR: 83.2 mL/min (ref >60.00)
Glucose, Bld: 87 mg/dL (ref 70–99)
POTASSIUM: 4.2 meq/L (ref 3.5–5.1)
Sodium: 140 mEq/L (ref 135–145)
Total Bilirubin: 0.5 mg/dL (ref 0.2–1.2)
Total Protein: 7.1 g/dL (ref 6.0–8.3)

## 2018-12-18 LAB — LIPID PANEL
CHOLESTEROL: 179 mg/dL (ref 0–200)
HDL: 46.2 mg/dL (ref >39.00)
LDL Cholesterol: 120 mg/dL — ABNORMAL HIGH (ref 0–99)
NONHDL: 132.66
TRIGLYCERIDES: 64 mg/dL (ref 0.0–149.0)
Total CHOL/HDL Ratio: 4
VLDL: 12.8 mg/dL (ref 0.0–40.0)

## 2018-12-18 NOTE — Telephone Encounter (Signed)
Left message asking pt to call office please r/s 3/24 appointment

## 2018-12-22 ENCOUNTER — Encounter: Payer: Managed Care, Other (non HMO) | Admitting: Family Medicine

## 2019-02-17 ENCOUNTER — Encounter: Payer: Self-pay | Admitting: Family Medicine

## 2019-02-17 ENCOUNTER — Ambulatory Visit: Payer: 59 | Admitting: Family Medicine

## 2019-02-17 ENCOUNTER — Telehealth: Payer: Self-pay | Admitting: Family Medicine

## 2019-02-17 NOTE — Telephone Encounter (Signed)
Pt scheduled for 02/19/19 @ 10am

## 2019-02-17 NOTE — Telephone Encounter (Signed)
Patient was scheduled for virtual CPE however insurance does not cover this virtually.  appt cancelled. plz call and schedule in-office CPE for patient over next few weeks/months. Thanks.

## 2019-02-17 NOTE — Progress Notes (Signed)
Virtual visit completed through [employer]. Due to national recommendations of social distancing due to COVID-19, a virtual visit is felt to be most appropriate for this patient at this time.   Patient location: home Provider location: [place] [facility], office If any vitals were documented, they were collected by patient at home unless specified below.    Temp (!) 97.3 F (36.3 C) (Oral)   Ht 5\' 7"  (1.702 m)   Wt 170 lb (77.1 kg)   BMI 26.63 kg/m    CC: CPE - appt will be rescheduled as his insurance does not cover virtual physicals.  Subjective:    Patient ID: Marcus [name], male    DOB: January 14, 1971, 48 y.o.   MRN: [id number]  HPI: Marcus [name] is a 48 y.o. male presenting on 02/17/2019 for Annual Exam   Preventative: Flu shot -yearly Td 2010 Seat belt use discussed Sunscreen use discussed. Denies changing moles on skin.  Non smoker Alcohol - occasional on weekends.  Spanish speaking, from [place]  Lives with wife and 2 sons and [place]  From [place], [place]. Occupation: works with [employer]  Edu: some college Administrator)  Activity: 3x/wk - trail running  Diet: good water, fruits/vegetables daily      Relevant past medical, surgical, family and social history reviewed and updated as indicated. Interim medical history since our last visit reviewed. Allergies and medications reviewed and updated. Outpatient Medications Prior to Visit  Medication Sig Dispense Refill  . omeprazole (PRILOSEC) 20 MG capsule Take 1 capsule (20 mg total) by mouth daily. 30 capsule 1   No facility-administered medications prior to visit.      Per HPI unless specifically indicated in ROS section below Review of Systems Objective:    Temp (!) 97.3 F (36.3 C) (Oral)   Ht 5\' 7"  (1.702 m)   Wt 170 lb (77.1 kg)   BMI 26.63 kg/m   Wt Readings from Last 3 Encounters:  02/17/19 170 lb (77.1 kg)  07/03/18 169 lb (76.7 kg)  12/19/17 173 lb (78.5 kg)     Physical  exam: Gen: alert, NAD, not ill appearing Pulm: speaks in complete sentences without increased work of breathing Psych: normal mood, normal thought content      Results for orders placed or performed in visit on 12/18/18  Comprehensive metabolic panel  Result Value Ref Range   Sodium 140 135 - 145 mEq/L   Potassium 4.2 3.5 - 5.1 mEq/L   Chloride 105 96 - 112 mEq/L   CO2 28 19 - 32 mEq/L   Glucose, Bld 87 70 - 99 mg/dL   BUN 13 6 - 23 mg/dL   Creatinine, Ser 0.97 0.40 - 1.50 mg/dL   Total Bilirubin 0.5 0.2 - 1.2 mg/dL   Alkaline Phosphatase 60 39 - 117 U/L   AST 16 0 - 37 U/L   ALT 23 0 - 53 U/L   Total Protein 7.1 6.0 - 8.3 g/dL   Albumin 4.4 3.5 - 5.2 g/dL   Calcium 9.0 8.4 - 10.5 mg/dL   GFR 83.20 >60.00 mL/min  Lipid panel  Result Value Ref Range   Cholesterol 179 0 - 200 mg/dL   Triglycerides 64.0 0.0 - 149.0 mg/dL   HDL 46.20 >39.00 mg/dL   VLDL 12.8 0.0 - 40.0 mg/dL   LDL Cholesterol 120 (H) 0 - 99 mg/dL   Total CHOL/HDL Ratio 4    NonHDL 132.66    Assessment & Plan:   Problem List Items Addressed This Visit  None    Visit Diagnoses    Erroneous encounter - disregard    -  Primary       No orders of the defined types were placed in this encounter.  No orders of the defined types were placed in this encounter.   Follow up plan: No follow-ups on file.  Ria Bush, MD

## 2019-02-19 ENCOUNTER — Ambulatory Visit (INDEPENDENT_AMBULATORY_CARE_PROVIDER_SITE_OTHER): Payer: 59 | Admitting: Family Medicine

## 2019-02-19 ENCOUNTER — Other Ambulatory Visit: Payer: Self-pay

## 2019-02-19 ENCOUNTER — Encounter: Payer: Self-pay | Admitting: Family Medicine

## 2019-02-19 VITALS — BP 120/72 | HR 57 | Temp 98.2°F | Ht 67.75 in | Wt 172.6 lb

## 2019-02-19 DIAGNOSIS — K648 Other hemorrhoids: Secondary | ICD-10-CM | POA: Insufficient documentation

## 2019-02-19 DIAGNOSIS — Z0000 Encounter for general adult medical examination without abnormal findings: Secondary | ICD-10-CM

## 2019-02-19 DIAGNOSIS — K603 Anal fistula: Secondary | ICD-10-CM | POA: Diagnosis not present

## 2019-02-19 DIAGNOSIS — K921 Melena: Secondary | ICD-10-CM

## 2019-02-19 NOTE — Assessment & Plan Note (Signed)
H/o this, stable period s/p repair 2015 Marcus [name])

## 2019-02-19 NOTE — Progress Notes (Signed)
This visit was conducted in person.  BP 120/72 (BP Location: Left Arm, Patient Position: Sitting, Cuff Size: Normal)   Pulse (!) 57   Temp 98.2 F (36.8 C) (Oral)   Ht 5' 7.75" (1.721 m)   Wt 172 lb 9 oz (78.3 kg)   SpO2 99%   BMI 26.43 kg/m    CC: CPE Subjective:    Patient ID: Marcus [name], male    DOB: [birth year], 48 y.o.   MRN: [id number]  HPI: Marcus [name] is a 48 y.o. male presenting on 02/19/2019 for Annual Exam   Notes some BRB in commode and in stool, not just with wiping. Episodes last 3-4 days. This happened during a time that he felt hemorrhoid flared. Last noted 3 wks ago. 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   Preventative: Flu shot -yearly Td 2010, Tdap today.  Seat belt use discussed Sunscreen use discussed. Denies changing moles on skin.  Non smoker Alcohol - occasional on weekends. Dentist q6 mo  Eye exam yearly   Spanish speaking, from [place]  Lives with wife and 2 sons and [place]  From [place], [place]. Occupation: works with [employer]  Edu: some college Administrator)  Activity:3x/wk - trail running  Diet: good water, fruits/vegetables daily      Relevant past medical, surgical, family and social history reviewed and updated as indicated. Interim medical history since our last visit reviewed. Allergies and medications reviewed and updated. No outpatient medications prior to visit.   No facility-administered medications prior to visit.      Per HPI unless specifically indicated in ROS section below Review of Systems  Constitutional: Negative for activity change, appetite change, chills, fatigue, fever and unexpected weight change.  HENT: Negative for hearing loss.   Eyes: Negative for visual disturbance.  Respiratory: Negative for cough, chest tightness, shortness of breath and wheezing.   Cardiovascular: Negative for chest pain, palpitations and leg swelling.  Gastrointestinal: Positive for blood in stool  (over last few weeks). Negative for abdominal distention, abdominal pain, constipation, diarrhea, nausea and vomiting.  Genitourinary: Negative for difficulty urinating and hematuria.  Musculoskeletal: Negative for arthralgias, myalgias and neck pain.  Skin: Negative for rash.  Neurological: Negative for dizziness, seizures, syncope and headaches.  Hematological: Negative for adenopathy. Does not bruise/bleed easily.  Psychiatric/Behavioral: Negative for dysphoric mood. The patient is not nervous/anxious.    Objective:    BP 120/72 (BP Location: Left Arm, Patient Position: Sitting, Cuff Size: Normal)   Pulse (!) 57   Temp 98.2 F (36.8 C) (Oral)   Ht 5' 7.75" (1.721 m)   Wt 172 lb 9 oz (78.3 kg)   SpO2 99%   BMI 26.43 kg/m   Wt Readings from Last 3 Encounters:  02/19/19 172 lb 9 oz (78.3 kg)  02/17/19 170 lb (77.1 kg)  07/03/18 169 lb (76.7 kg)    Physical Exam Vitals signs and nursing note reviewed.  Constitutional:      General: He is not in acute distress.    Appearance: Normal appearance. He is well-developed.  HENT:     Head: Normocephalic and atraumatic.     Right Ear: Hearing, tympanic membrane, ear canal and external ear normal.     Left Ear: Hearing, tympanic membrane, ear canal and external ear normal.     Nose: Nose normal.     Mouth/Throat:     Pharynx: Uvula midline. No oropharyngeal exudate or posterior oropharyngeal erythema.  Eyes:     General: No scleral icterus.  Conjunctiva/sclera: Conjunctivae normal.     Pupils: Pupils are equal, round, and reactive to light.  Neck:     Musculoskeletal: Normal range of motion and neck supple.  Cardiovascular:     Rate and Rhythm: Normal rate and regular rhythm.     Pulses: Normal pulses.          Radial pulses are 2+ on the right side and 2+ on the left side.     Heart sounds: Normal heart sounds. No murmur.  Pulmonary:     Effort: Pulmonary effort is normal. No respiratory distress.     Breath sounds: Normal  breath sounds. No wheezing, rhonchi or rales.  Abdominal:     General: Bowel sounds are normal. There is no distension.     Palpations: Abdomen is soft. There is no mass.     Tenderness: There is no abdominal tenderness. There is no guarding or rebound.  Genitourinary:    Rectum: Normal. No mass, tenderness, anal fissure, external hemorrhoid or internal hemorrhoid. Normal anal tone.     Comments: No inflamed hemorrhoid at this time Musculoskeletal: Normal range of motion.     Right lower leg: No edema.     Left lower leg: No edema.  Lymphadenopathy:     Cervical: No cervical adenopathy.  Skin:    General: Skin is warm and dry.     Findings: No rash.  Neurological:     Mental Status: He is alert and oriented to person, place, and time.     Comments: CN grossly intact, station and gait intact  Psychiatric:        Mood and Affect: Mood normal.        Behavior: Behavior normal.        Thought Content: Thought content normal.        Judgment: Judgment normal.       Results for orders placed or performed in visit on 12/18/18  Comprehensive metabolic panel  Result Value Ref Range   Sodium 140 135 - 145 mEq/L   Potassium 4.2 3.5 - 5.1 mEq/L   Chloride 105 96 - 112 mEq/L   CO2 28 19 - 32 mEq/L   Glucose, Bld 87 70 - 99 mg/dL   BUN 13 6 - 23 mg/dL   Creatinine, Ser 0.97 0.40 - 1.50 mg/dL   Total Bilirubin 0.5 0.2 - 1.2 mg/dL   Alkaline Phosphatase 60 39 - 117 U/L   AST 16 0 - 37 U/L   ALT 23 0 - 53 U/L   Total Protein 7.1 6.0 - 8.3 g/dL   Albumin 4.4 3.5 - 5.2 g/dL   Calcium 9.0 8.4 - 10.5 mg/dL   GFR 83.20 >60.00 mL/min  Lipid panel  Result Value Ref Range   Cholesterol 179 0 - 200 mg/dL   Triglycerides 64.0 0.0 - 149.0 mg/dL   HDL 46.20 >39.00 mg/dL   VLDL 12.8 0.0 - 40.0 mg/dL   LDL Cholesterol 120 (H) 0 - 99 mg/dL   Total CHOL/HDL Ratio 4    NonHDL 132.66    Assessment & Plan:   Problem List Items Addressed This Visit    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 Discussed healthy diet and lifestyle.       Blood in stool    Anticipate hemorrhoidal however given amt of blood endorsed reasonable  to refer to GI for evaluation. Pt agrees with plan.       Relevant Orders   Ambulatory referral to Gastroenterology       No orders of the defined types were placed in this encounter.  Orders Placed This Encounter  Procedures  . Ambulatory referral to Gastroenterology    Referral Priority:   Routine    Referral Type:   Consultation    Referral Reason:   Specialty Services Required    Number of Visits Requested:   1    Follow up plan: Return in about 1 year (around 02/19/2020) for annual exam, prior fasting for blood work.  Ria Bush, MD

## 2019-02-19 NOTE — Patient Instructions (Addendum)
Tdap today.  Lo voy a remitir a Brewing technologist para Doctor, hospital.  Gusto verlo hoy  regresar en 1 ao para proximo examen fisico.   Mantenimiento de la salud en los hombres (Health Maintenance, Male) Un estilo de vida saludable y los cuidados preventivos son importantes para la salud y Musician. Pregntele al mdico cul es el cronograma de exmenes peridicos adecuado para usted. [place]? Consuma una dieta saludable  Coma muchas verduras, frutas, cereales integrales, productos lcteos con bajo contenido de grasa y Advertising account planner.  No consuma muchos alimentos de alto contenido de grasas slidas, azcares agregados o sal. Mantenga un peso saludable La actividad fsica habitual puede ayudarlo a Science writer o mantener un peso saludable. Deber hacer lo siguiente:  Realizar al menos 158minutos de actividad fsica por semana. El ejercicio debe aumentar la frecuencia cardaca y Actor la transpiracin (ejercicio de [place]).  Hacer ejercicios de entrenamiento de fuerza por lo [employer] por semana. Controlarse los niveles de colesterol y lpidos en la sangre  Hgase anlisis de sangre para controlar los lpidos y el colesterol cada 5aos a partir de los 35aos. Si tiene un riesgo alto de Best boy cardiopatas coronarias, debe comenzar a [employer] de [place] a los [place]. Es posible que Automotive engineer los niveles de colesterol con mayor frecuencia si: ? Sus niveles de lpidos y colesterol son altos. ? Es mayor de 54YTK. ? Tiene un riesgo alto de tener cardiopatas coronarias. QU DEBO SABER SOBRE LAS PRUEBAS DE [place]? Muchos tipos de cncer se pueden detectar de manera temprana y a menudo prevenirse. Cncer de pulmn  Debe someterse a pruebas de deteccin de cncer de pulmn todos los aos en los siguientes casos: ? Si fuma actualmente y lo ha hecho durante por lo menos 30aos. ? Si fue  fumador que dej el hbito en el trmino de los ltimos 15aos.  Hable con el mdico sobre las opciones en relacin con los estudios de deteccin, cundo debe comenzar a Insurance underwriter y con Armed forces operational officer. Cncer colorrectal  Generalmente, las pruebas de deteccin habituales del cncer colorrectal comienzan a los 50aos y deben repetirse cada 5 a 10aos hasta los 75aos. Es posible que tenga que hacerse las pruebas con mayor frecuencia si se detectan formas tempranas de plipos precancerosos o pequeos bultos. Sin embargo, el mdico podr aconsejarle que lo haga antes, si tiene factores de riesgo para el cncer de colon.  El mdico puede recomendarle que use kits de prueba caseros para Publishing rights manager oculta en la materia fecal.  Se puede usar una pequea cmara en el extremo de un tubo para examinar el colon (sigmoidoscopia o colonoscopia). Este estudio [employer] formas ms tempranas de Surveyor, minerals. Cncer de prstata y de testculo  En funcin de la edad y del Watchtower de salud general, el mdico puede realizarle determinados estudios de deteccin del cncer de prstata y de testculo.  Hable con el mdico sobre cualquier sntoma o acerca de las inquietudes que tenga sobre el cncer de prstata o de testculo. Cncer de piel  Revise la piel de la cabeza a los pies con regularidad.  Informe al mdico si aparecen nuevos lunares o si nota cambios en los que ya tiene, especialmente en estos casos: ? Si hay un cambio en el tamao, la forma o el color del lunar. ? Si tiene un lunar que es ms grande que el tamao de una goma de Games developer.  Siempre use pantalla  solar. Aplquese pantalla solar de Lanell Matar y repetida a lo largo del Training and development officer.  Use mangas y [employer], un sombrero de ala ancha y gafas para el sol cuando est al Alexis Goodell, para protegerse. QU DEBO SABER SOBRE LAS CARDIOPATAS CORONARIAS, LA DIABETES Y LA HIPERTENSIN ARTERIAL?  Si usted tiene entre 18 y 27aos, debe  medirse la presin arterial cada 3a 5aos. Si usted tiene 40aos o ms, debe medirse la presin arterial [employer]. Debe medirse la presin arterial dos veces: una vez cuando est en un hospital o una clnica y la otra vez cuando est en otro sitio. Registre el promedio de [employer]. Para controlar su presin arterial cuando no est en un hospital o [place], puede usar lo siguiente: ? Jorje Guild automtica para medir la presin arterial en una farmacia. ? Un monitor para medir la presin arterial en el hogar.  Hable con el mdico [employer] ideales de la presin arterial.  Si tiene entre 90 y 79aos, consltele al mdico si debe tomar aspirina para evitar las cardiopatas coronarias.  Hgase anlisis habituales de deteccin de la diabetes; para ello, contrlese la glucemia en ayunas. ? Si su peso es normal y tiene un bajo riesgo de padecer diabetes, realcese este anlisis cada tres aos despus de los 45aos. ? Si tiene sobrepeso y un alto riesgo de padecer diabetes, considere someterse a este anlisis antes o con mayor frecuencia.  Para los hombres que tienen entre 20 y 10aos, y son o han sido fumadores, se recomienda un nico estudio con ecografa para Hydrographic surveyor un aneurisma de aorta abdominal (AAA). QU DEBO SABER SOBRE LA PREVENCIN DE LAS INFECCIONES? HepatitisB Si tiene un riesgo ms alto de Museum/gallery curator hepatitis B, debe someterse a un examen de deteccin de este virus. Hable con el mdico para determinar si corre riesgo de tener una infeccin por hepatitisB. Hepatitis C Se recomienda un anlisis de [place] para:  Todos los que nacieron entre 1945 y [phone].  Todas las personas que tengan un riesgo de haber contrado hepatitis C. Enfermedades de transmisin sexual (ETS)  Debe realizarse pruebas de Programme researcher, broadcasting/film/video de las ETS todos los aos, incluidas la gonorrea y la clamidia, en estos casos: ? Es sexualmente activo y es menor de [place]. ? Es mayor de 24aos, y  Investment banker, operational informa que corre riesgo de tener este tipo de infecciones. ? La actividad sexual ha cambiado desde que le hicieron la ltima prueba de deteccin y tiene un riesgo mayor de Best boy clamidia o Radio broadcast assistant. Pregntele al mdico si usted tiene riesgo.  Consulte a su mdico para saber si tiene un alto riesgo de infectarse por el VIH. El mdico puede recomendarle un medicamento de venta con receta para ayudar a evitar la infeccin por el VIH. QU MS PUEDO HACER?  Realcese los estudios de rutina de la salud, dentales y de Public librarian.  [place] (inmunizaciones).  No consuma ningn producto que contenga tabaco, lo que incluye cigarrillos, tabaco de Higher education careers adviser y Psychologist, sport and exercise. Si necesita ayuda para dejar de fumar, consulte al mdico.  Limite el consumo de alcohol a no ms de 64medidas por da. [employer] a 12 onzas de cerveza, 5onzas de vino o 1onzas de bebidas alcohlicas de alta graduacin.  No consuma drogas.  No comparta agujas.  Solicite ayuda a su mdico si necesita apoyo o informacin para abandonar las drogas.  Informe a su mdico si a menudo se siente deprimido.  Notifique  a su mdico si alguna vez ha sido vctima de abuso o si no se siente Scientist, forensic. Esta informacin no tiene Marine scientist el consejo del mdico. Asegrese de hacerle al mdico cualquier pregunta que tenga. Document Released: 03/14/2008 Document Revised: 10/07/2014 Document Reviewed: 06/20/2015 Elsevier Interactive Patient Education  2019 [employer].

## 2019-02-19 NOTE — Assessment & Plan Note (Signed)
Anticipate hemorrhoidal however given amt of blood endorsed reasonable to refer to GI for evaluation. Pt agrees with plan.

## 2019-02-19 NOTE — Assessment & Plan Note (Signed)
Preventative protocols reviewed and updated unless pt declined. Discussed healthy diet and lifestyle.  

## 2019-03-08 ENCOUNTER — Telehealth: Payer: Self-pay

## 2019-03-08 NOTE — Telephone Encounter (Signed)
Covid-19 screening questions  Have you traveled in the last 14 days? Yes  If yes where? [place] 03-01-2019 to 03-04-2019 for work   Do you now or have you had a fever in the last 14 days? no  Do you have any respiratory symptoms of shortness of breath or cough now or in the last 14 days? no  Do you have any family members or close contacts with diagnosed or suspected Covid-19 in the past 14 days? no  Have you been tested for Covid-19 and found to be positive? no

## 2019-03-09 ENCOUNTER — Encounter: Payer: Self-pay | Admitting: Gastroenterology

## 2019-03-09 ENCOUNTER — Other Ambulatory Visit: Payer: Self-pay

## 2019-03-09 ENCOUNTER — Ambulatory Visit (INDEPENDENT_AMBULATORY_CARE_PROVIDER_SITE_OTHER): Payer: 59 | Admitting: Gastroenterology

## 2019-03-09 VITALS — BP 110/66 | HR 58 | Temp 98.5°F | Ht 67.0 in | Wt 173.1 lb

## 2019-03-09 DIAGNOSIS — K625 Hemorrhage of anus and rectum: Secondary | ICD-10-CM | POA: Diagnosis not present

## 2019-03-09 MED ORDER — SUPREP BOWEL PREP KIT 17.5-3.13-1.6 GM/177ML PO SOLN: 1.0000 | ORAL | 0 refills | Status: DC

## 2019-03-09 NOTE — Progress Notes (Addendum)
[facility] Consult Note:  History: Marcus [name] 03/09/2019  Referring provider: Ria Bush, MD  Reason for consult/chief complaint: Blood In Stools (said he had it a few weeks ago. hasn't seen any blood )   Subjective  HPI: Had surgical repair perianal fistula in 2015.  Marcus [name] is a healthy 48 year old man who had several days of painless rectal bleeding about 6 weeks ago.  His bowel habits have remained regular, he denies abdominal pain, loss of appetite or weight loss.  He denies chronic upper digestive symptoms such as nausea, vomiting, dysphagia or odynophagia. This bleeding was recently brought to the attention of his primary care provider, who then referred him to [place].  He has no chronic digestive issues, he developed biliary pancreatitis requiring cholecystectomy and ERCP in 2015   ROS:  Review of Systems  Constitutional: Negative for appetite change and unexpected weight change.  HENT: Negative for mouth sores and voice change.   Eyes: Negative for pain and redness.  Respiratory: Negative for cough and shortness of breath.   Cardiovascular: Negative for chest pain and palpitations.  Genitourinary: Negative for dysuria and hematuria.  Musculoskeletal: Negative for arthralgias and myalgias.  Skin: Negative for pallor and rash.  Neurological: Negative for weakness and headaches.  Hematological: Negative for adenopathy.     Past Medical History: Past Medical History:  Diagnosis Date  . Asthma    as child only  . History of duodenal ulcer 2005   worse with stress  . Pancreatitis, gallstone 06/07/2014   S/p cholecystectomy, ERCP retrieval of intraductal stone (05/2014)   . Perirectal fistula      Past Surgical History: Past Surgical History:  Procedure Laterality Date  . CHOLECYSTECTOMY N/A 06/09/2014   with IOC, for gallstone pancreatitis, [name], MD  . ERCP N/A 06/10/2014   gallstone in pancreatic duct, Irene Shipper, MD  .  EVALUATION UNDER ANESTHESIA WITH FISTULECTOMY N/A 08/18/2014   Procedure: internal hemorrhoidectomy, ligation of intershpinteric fistula tract (LIFT);  Surgeon: Michael Boston, MD  . TONSILLECTOMY    . WRIST FRACTURE SURGERY Left 2011   hardware (pins)     Family History: Family History  Problem Relation Age of Onset  . Diabetes Maternal Grandmother   . CAD Maternal Grandmother 82       MI  . Cancer Neg Hx   . Stroke Neg Hx   . Colon cancer Neg Hx   . Esophageal cancer Neg Hx     Social History: Social History   Socioeconomic History  . Marital status: Married    Spouse name: Not on file  . Number of children: Not on file  . Years of education: Not on file  . Highest education level: Not on file  Occupational History  . Not on file  Social Needs  . Financial resource strain: Not on file  . Food insecurity:    Worry: Not on file    Inability: Not on file  . Transportation needs:    Medical: Not on file    Non-medical: Not on file  Tobacco Use  . Smoking status: Never Smoker  . Smokeless tobacco: Never Used  Substance and Sexual Activity  . Alcohol use: Yes    Comment: Occasional  . Drug use: No  . Sexual activity: Not on file  Lifestyle  . Physical activity:    Days per week: Not on file    Minutes per session: Not on file  . Stress: Not on file  Relationships  .  Social connections:    Talks on phone: Not on file    Gets together: Not on file    Attends religious service: Not on file    Active member of club or organization: Not on file    Attends meetings of clubs or organizations: Not on file    Relationship status: Not on file  Other Topics Concern  . Not on file  Social History Narrative   Spanish speaking, from [place]   Lives with wife and 2 sons and [place]   From [place], [place].     Occupation: works with Chief Executive Officer   Edu: some college Administrator)   Activity: 2x/wk - trail running   Diet: good water, fruits/vegetables daily     Allergies: No Known Allergies  Outpatient Meds: No current outpatient medications on file.   No current facility-administered medications for this visit.       ___________________________________________________________________ Objective   Exam:  BP 110/66   Pulse (!) 58   Temp 98.5 F (36.9 C)   Ht 5\' 7"  (1.702 m)   Wt 173 lb 2 oz (78.5 kg)   BMI 27.12 kg/m    General: Well-appearing  Eyes: sclera anicteric, no redness  ENT: oral mucosa moist without lesions, no cervical or supraclavicular lymphadenopathy  CV: RRR without murmur, S1/S2, no JVD, no peripheral edema  Resp: clear to auscultation bilaterally, normal RR and effort noted  GI: soft, no tenderness, with active bowel sounds. No guarding or palpable organomegaly noted.  Skin; warm and dry, no rash or jaundice noted  Neuro: awake, alert and oriented x 3. Normal gross motor function and fluent speech Rectal: Posterior fistulectomy scar, no drainage, open area or tenderness. No perianal or distal rectal tenderness and no palpable abnormalities.  Labs:  No recent labs  Assessment: Encounter Diagnosis  Name Primary?  . Rectal bleeding Yes    No red flag symptoms, and does not appear to be related to the previous fistula with perirectal abscess. No chronic digestive symptoms.  Nevertheless, I feel he should have a colonoscopy to determine the cause and certainly rule out neoplasia.  He is agreeable after discussion of procedure and risks.  Our office staff work with him today for initial scheduling, and he plans to check with his work schedule and get back to [place] soon.   Thank you for the courtesy of this consult.  Please call me with any questions or concerns.  Nelida Meuse III  CC: Referring provider noted above

## 2019-03-09 NOTE — Addendum Note (Signed)
Addended by: Grace Bushy A on: 03/09/2019 10:05 AM   Modules accepted: Orders

## 2019-03-09 NOTE — Patient Instructions (Signed)
You have been scheduled for a colonoscopy. Please follow written instructions given to you at your visit today.  Please pick up your prep supplies at the pharmacy within the next 1-3 days. If you use inhalers (even only as needed), please bring them with you on the day of your procedure. Your physician has requested that you go to www.startemmi.com and enter the access code given to you at your visit today. This web site gives a general overview about your procedure. However, you should still follow specific instructions given to you by our office regarding your preparation for the procedure.  Thank you for entrusting me with your care and choosing [facility].  Dr Loletha Carrow

## 2019-04-08 ENCOUNTER — Encounter: Payer: 59 | Admitting: Gastroenterology

## 2019-11-29 HISTORY — PX: COLONOSCOPY: SHX174

## 2019-12-13 ENCOUNTER — Other Ambulatory Visit: Payer: Self-pay

## 2019-12-13 ENCOUNTER — Encounter: Payer: Self-pay | Admitting: Gastroenterology

## 2019-12-13 ENCOUNTER — Ambulatory Visit (AMBULATORY_SURGERY_CENTER): Payer: 59 | Admitting: *Deleted

## 2019-12-13 VITALS — Temp 98.0°F | Ht 67.0 in | Wt 171.6 lb

## 2019-12-13 DIAGNOSIS — Z1159 Encounter for screening for other viral diseases: Secondary | ICD-10-CM

## 2019-12-13 DIAGNOSIS — K625 Hemorrhage of anus and rectum: Secondary | ICD-10-CM

## 2019-12-13 MED ORDER — SUPREP BOWEL PREP KIT 17.5-3.13-1.6 GM/177ML PO SOLN: 1.0000 | Freq: Once | ORAL | 0 refills | Status: AC

## 2019-12-13 NOTE — Progress Notes (Signed)
Patient denies any allergies to egg or soy products. Patient denies complications with anesthesia/sedation.  Patient denies oxygen use at home and denies diet medications. Emmi instructions for colonoscopy/endoscopy explained and given to patient.  Suprep coupon given to patient.  Patient reads and speaks [place] and [place].  No Interpreter Needed for PV appt.

## 2019-12-20 ENCOUNTER — Ambulatory Visit (INDEPENDENT_AMBULATORY_CARE_PROVIDER_SITE_OTHER): Payer: 59

## 2019-12-20 ENCOUNTER — Other Ambulatory Visit: Payer: Self-pay | Admitting: Gastroenterology

## 2019-12-20 ENCOUNTER — Other Ambulatory Visit: Payer: Self-pay

## 2019-12-20 DIAGNOSIS — Z1159 Encounter for screening for other viral diseases: Secondary | ICD-10-CM

## 2019-12-21 LAB — SARS CORONAVIRUS 2 (TAT 6-24 HRS): SARS Coronavirus 2: NEGATIVE

## 2019-12-23 ENCOUNTER — Other Ambulatory Visit: Payer: Self-pay

## 2019-12-23 ENCOUNTER — Ambulatory Visit (AMBULATORY_SURGERY_CENTER): Payer: 59 | Admitting: Gastroenterology

## 2019-12-23 ENCOUNTER — Encounter: Payer: Self-pay | Admitting: Gastroenterology

## 2019-12-23 VITALS — BP 102/64 | HR 68 | Temp 96.9°F | Resp 18 | Ht 67.0 in | Wt 171.0 lb

## 2019-12-23 DIAGNOSIS — D123 Benign neoplasm of transverse colon: Secondary | ICD-10-CM

## 2019-12-23 DIAGNOSIS — K648 Other hemorrhoids: Secondary | ICD-10-CM | POA: Diagnosis not present

## 2019-12-23 DIAGNOSIS — D122 Benign neoplasm of ascending colon: Secondary | ICD-10-CM

## 2019-12-23 DIAGNOSIS — K625 Hemorrhage of anus and rectum: Secondary | ICD-10-CM | POA: Diagnosis present

## 2019-12-23 MED ORDER — SODIUM CHLORIDE 0.9 % IV SOLN: 500.0000 mL | Freq: Once | INTRAVENOUS | Status: DC

## 2019-12-23 NOTE — Progress Notes (Signed)
Called to room to assist during endoscopic procedure.  Patient ID and intended procedure confirmed with present staff. Received instructions for my participation in the procedure from the performing physician.  

## 2019-12-23 NOTE — Op Note (Signed)
[facility] Patient Name: Shawna Frieders Procedure Date: 12/23/2019 2:00 PM MRN: [id number] Endoscopist: Dundee. Loletha Carrow , MD Age: 49 Referring MD:  Date of Birth: 12/11/1970 Gender: Male Account #: [id number] Procedure:                Colonoscopy Indications:              Rectal bleeding Medicines:                Monitored Anesthesia Care Procedure:                Pre-Anesthesia Assessment:                           - Prior to the procedure, a History and Physical                            was performed, and patient medications and                            allergies were reviewed. The patient's tolerance of                            previous anesthesia was also reviewed. The risks                            and benefits of the procedure and the sedation                            options and risks were discussed with the patient.                            All questions were answered, and informed consent                            was obtained. Prior Anticoagulants: The patient has                            taken no previous anticoagulant or antiplatelet                            agents. ASA Grade Assessment: II - A patient with                            mild systemic disease. After reviewing the risks                            and benefits, the patient was deemed in                            satisfactory condition to undergo the procedure.                           After obtaining informed consent, the colonoscope  was passed under direct vision. Throughout the                            procedure, the patient's blood pressure, pulse, and                            oxygen saturations were monitored continuously. The                            Colonoscope was introduced through the anus and                            advanced to the the terminal ileum, with                            identification of the appendiceal orifice and IC                     valve. The colonoscopy was performed without                            difficulty. The patient tolerated the procedure                            well. The quality of the bowel preparation was                            excellent. The terminal ileum, ileocecal valve,                            appendiceal orifice, and rectum were photographed.                            The quality of the bowel preparation was evaluated                            using the BBPS [facility] Bowel Preparation Scale)                            with scores of: Right Colon = 3, Transverse Colon =                            3 and Left Colon = 3. The total BBPS score equals 9. Scope In: 2:13:31 PM Scope Out: 2:29:10 PM Scope Withdrawal Time: 0 hours 12 minutes 7 seconds  Total Procedure Duration: 0 hours 15 minutes 39 seconds  Findings:                 The perianal and digital rectal examinations were                            normal.                           The terminal ileum appeared normal.  Two sessile polyps were found in the transverse                            colon and ascending colon. The polyps were                            diminutive in size. These polyps were removed with                            a cold snare. Resection and retrieval were complete.                           Internal hemorrhoids were found.                           The exam was otherwise without abnormality on                            direct and retroflexion views. Complications:            No immediate complications. Estimated Blood Loss:     Estimated blood loss was minimal. Impression:               - The examined portion of the ileum was normal.                           - Two diminutive polyps in the transverse colon and                            in the ascending colon, removed with a cold snare.                            Resected and retrieved.                           -  Internal hemorrhoids.                           - The examination was otherwise normal on direct                            and retroflexion views. Recommendation:           - Patient has a contact number available for                            emergencies. The signs and symptoms of potential                            delayed complications were discussed with the                            patient. Return to normal activities tomorrow.                            Written discharge  instructions were provided to the                            patient.                           - Resume previous diet.                           - Continue present medications.                           - Await pathology results.                           - Repeat colonoscopy is recommended for                            surveillance. The colonoscopy date will be                            determined after pathology results from today's                            exam become available for review.                           - Hemorrhoids are amenable to banding if patient                            desires. Emelly Wurtz L. Loletha Carrow, MD 12/23/2019 2:35:05 PM This report has been signed electronically.

## 2019-12-23 NOTE — Progress Notes (Signed)
Vitals-DT Temp-LC  Pt's states no medical or surgical changes since previsit or office visit.

## 2019-12-23 NOTE — Patient Instructions (Signed)
Handouts Provided:  Polyps and Hemorrhoid banding  YOU HAD AN ENDOSCOPIC PROCEDURE TODAY AT THE [facility]:   Refer to the procedure report that was given to you for any specific questions about what was found during the examination.  If the procedure report does not answer your questions, please call your gastroenterologist to clarify.  If you requested that your care partner not be given the details of your procedure findings, then the procedure report has been included in a sealed envelope for you to review at your convenience later.  YOU SHOULD EXPECT: Some feelings of bloating in the abdomen. Passage of more gas than usual.  Walking can help get rid of the air that was put into your GI tract during the procedure and reduce the bloating. If you had a lower endoscopy (such as a colonoscopy or flexible sigmoidoscopy) you may notice spotting of blood in your stool or on the toilet paper. If you underwent a bowel prep for your procedure, you may not have a normal bowel movement for a few days.  Please Note:  You might notice some irritation and congestion in your nose or some drainage.  This is from the oxygen used during your procedure.  There is no need for concern and it should clear up in a day or so.  SYMPTOMS TO REPORT IMMEDIATELY:   Following lower endoscopy (colonoscopy or flexible sigmoidoscopy):  Excessive amounts of blood in the stool  Significant tenderness or worsening of abdominal pains  Swelling of the abdomen that is new, acute  Fever of 100F or higher  For urgent or emergent issues, a gastroenterologist can be reached at any hour by calling [phone]. Do not use MyChart messaging for urgent concerns.    DIET:  We do recommend a small meal at first, but then you may proceed to your regular diet.  Drink plenty of fluids but you should avoid alcoholic beverages for 24 hours.  ACTIVITY:  You should plan to take it easy for the rest of today and you should NOT  DRIVE or use heavy machinery until tomorrow (because of the sedation medicines used during the test).    FOLLOW UP: Our staff will call the number listed on your records 48-72 hours following your procedure to check on you and address any questions or concerns that you may have regarding the information given to you following your procedure. If we do not reach you, we will leave a message.  We will attempt to reach you two times.  During this call, we will ask if you have developed any symptoms of COVID 19. If you develop any symptoms (ie: fever, flu-like symptoms, shortness of breath, cough etc.) before then, please call [phone].  If you test positive for Covid 19 in the 2 weeks post procedure, please call and report this information to [place].    If any biopsies were taken you will be contacted by phone or by letter within the next 1-3 weeks.  Please call us at [phone] if you have not heard about the biopsies in 3 weeks.    SIGNATURES/CONFIDENTIALITY: You and/or your care partner have signed paperwork which will be entered into your electronic medical record.  These signatures attest to the fact that that the information above on your After Visit Summary has been reviewed and is understood.  Full responsibility of the confidentiality of this discharge information lies with you and/or your care-partner.

## 2019-12-23 NOTE — Progress Notes (Signed)
pt tolerated well. VSS. awake and to recovery. Report given to RN.  

## 2019-12-27 ENCOUNTER — Telehealth: Payer: Self-pay

## 2019-12-27 ENCOUNTER — Telehealth: Payer: Self-pay | Admitting: *Deleted

## 2019-12-27 NOTE — Telephone Encounter (Signed)
Left message on f/u call 

## 2019-12-27 NOTE — Telephone Encounter (Signed)
Left message on answering machine. 

## 2020-01-02 ENCOUNTER — Encounter: Payer: Self-pay | Admitting: Gastroenterology

## 2020-02-17 ENCOUNTER — Other Ambulatory Visit: Payer: Self-pay | Admitting: Family Medicine

## 2020-02-17 ENCOUNTER — Encounter: Payer: Self-pay | Admitting: Family Medicine

## 2020-02-17 DIAGNOSIS — K219 Gastro-esophageal reflux disease without esophagitis: Secondary | ICD-10-CM

## 2020-02-17 DIAGNOSIS — K921 Melena: Secondary | ICD-10-CM

## 2020-02-17 DIAGNOSIS — E785 Hyperlipidemia, unspecified: Secondary | ICD-10-CM

## 2020-02-18 ENCOUNTER — Other Ambulatory Visit (INDEPENDENT_AMBULATORY_CARE_PROVIDER_SITE_OTHER): Payer: 59

## 2020-02-18 DIAGNOSIS — E785 Hyperlipidemia, unspecified: Secondary | ICD-10-CM

## 2020-02-18 DIAGNOSIS — K921 Melena: Secondary | ICD-10-CM

## 2020-02-18 LAB — LIPID PANEL
Cholesterol: 151 mg/dL (ref 0–200)
HDL: 45.4 mg/dL (ref >39.00)
LDL Cholesterol: 95 mg/dL (ref 0–99)
NonHDL: 105.87
Total CHOL/HDL Ratio: 3
Triglycerides: 53 mg/dL (ref 0.0–149.0)
VLDL: 10.6 mg/dL (ref 0.0–40.0)

## 2020-02-18 LAB — COMPREHENSIVE METABOLIC PANEL
ALT: 23 U/L (ref 0–53)
AST: 16 U/L (ref 0–37)
Albumin: 4.3 g/dL (ref 3.5–5.2)
Alkaline Phosphatase: 55 U/L (ref 39–117)
BUN: 14 mg/dL (ref 6–23)
CO2: 28 mEq/L (ref 19–32)
Calcium: 9 mg/dL (ref 8.4–10.5)
Chloride: 106 mEq/L (ref 96–112)
Creatinine, Ser: 0.93 mg/dL (ref 0.40–1.50)
GFR: 86.9 mL/min (ref >60.00)
Glucose, Bld: 93 mg/dL (ref 70–99)
Potassium: 4.3 mEq/L (ref 3.5–5.1)
Sodium: 137 mEq/L (ref 135–145)
Total Bilirubin: 0.5 mg/dL (ref 0.2–1.2)
Total Protein: 6.5 g/dL (ref 6.0–8.3)

## 2020-02-18 LAB — CBC WITH DIFFERENTIAL/PLATELET
Basophils Absolute: 0 10*3/uL (ref 0.0–0.1)
Basophils Relative: 1 % (ref 0.0–3.0)
Eosinophils Absolute: 0.2 10*3/uL (ref 0.0–0.7)
Eosinophils Relative: 4.1 % (ref 0.0–5.0)
HCT: 42.7 % (ref 39.0–52.0)
Hemoglobin: 14.6 g/dL (ref 13.0–17.0)
Lymphocytes Relative: 31.1 % (ref 12.0–46.0)
Lymphs Abs: 1.5 10*3/uL (ref 0.7–4.0)
MCHC: 34.3 g/dL (ref 30.0–36.0)
MCV: 92.3 fl (ref 78.0–100.0)
Monocytes Absolute: 0.4 10*3/uL (ref 0.1–1.0)
Monocytes Relative: 9 % (ref 3.0–12.0)
Neutro Abs: 2.6 10*3/uL (ref 1.4–7.7)
Neutrophils Relative %: 54.8 % (ref 43.0–77.0)
Platelets: 236 10*3/uL (ref 150.0–400.0)
RBC: 4.63 Mil/uL (ref 4.22–5.81)
RDW: 12.4 % (ref 11.5–15.5)
WBC: 4.8 10*3/uL (ref 4.0–10.5)

## 2020-02-21 ENCOUNTER — Encounter: Payer: 59 | Admitting: Family Medicine

## 2020-02-24 ENCOUNTER — Ambulatory Visit (INDEPENDENT_AMBULATORY_CARE_PROVIDER_SITE_OTHER): Payer: 59 | Admitting: Family Medicine

## 2020-02-24 ENCOUNTER — Encounter: Payer: Self-pay | Admitting: Family Medicine

## 2020-02-24 ENCOUNTER — Other Ambulatory Visit: Payer: Self-pay

## 2020-02-24 VITALS — BP 118/64 | HR 66 | Temp 97.9°F | Ht 67.0 in | Wt 166.1 lb

## 2020-02-24 DIAGNOSIS — Z23 Encounter for immunization: Secondary | ICD-10-CM

## 2020-02-24 DIAGNOSIS — Z0000 Encounter for general adult medical examination without abnormal findings: Secondary | ICD-10-CM | POA: Diagnosis not present

## 2020-02-24 NOTE — Progress Notes (Signed)
This visit was conducted in person.  BP 118/64 (BP Location: Left Arm, Patient Position: Sitting, Cuff Size: Normal)   Pulse 66   Temp 97.9 F (36.6 C) (Temporal)   Ht 5\' 7"  (1.702 m)   Wt 166 lb 1 oz (75.3 kg)   SpO2 98%   BMI 26.01 kg/m    CC: CPE Subjective:    Patient ID: Marcus [name], male    DOB: 1970-12-28, 49 y.o.   MRN: [id number]  HPI: Shehzad Tell is a 49 y.o. male presenting on 02/24/2020 for Annual Exam   H/o perirectal fistula.  Preventative: COLONOSCOPY 11/2019 - 2 small polyps, int hem, repeat 7 yrs ([facility])  Flu shot -yearly  Td 2010, Tdap completed  COVID vaccine - completed [place] 12/2019  Seat belt use discussed Sunscreen use discussed. Denies changing moles on skin.  Non smoker  Alcohol - occasional on weekends. Dentist q6 mo  Eye exam yearly   Spanish speaking, from [place], [place]  Lives with wife and 2 sons and [name]  Occupation: works with logistics  Edu: some college ([employer], broadcasting/film/video)  Activity:3x/wk - some trail running and walking 2-3 mi/day  Diet: good water, fruits/vegetables daily     Relevant past medical, surgical, family and social history reviewed and updated as indicated. Interim medical history since our last visit reviewed. Allergies and medications reviewed and updated. No outpatient medications prior to visit.   No facility-administered medications prior to visit.     Per HPI unless specifically indicated in ROS section below Review of Systems  Constitutional: Negative for activity change, appetite change, chills, fatigue, fever and unexpected weight change.  HENT: Negative for hearing loss.   Eyes: Negative for visual disturbance.  Respiratory: Negative for cough, chest tightness, shortness of breath and wheezing.   Cardiovascular: Negative for chest pain, palpitations and leg swelling.  Gastrointestinal: Negative for abdominal distention, abdominal pain, blood in stool, constipation, diarrhea, nausea and  vomiting.  Genitourinary: Negative for difficulty urinating and hematuria.  Musculoskeletal: Negative for arthralgias, myalgias and neck pain.  Skin: Negative for rash.  Neurological: Negative for dizziness, seizures, syncope and headaches.  Hematological: Negative for adenopathy. Does not bruise/bleed easily.  Psychiatric/Behavioral: Negative for dysphoric mood. The patient is not nervous/anxious.    Objective:  BP 118/64 (BP Location: Left Arm, Patient Position: Sitting, Cuff Size: Normal)   Pulse 66   Temp 97.9 F (36.6 C) (Temporal)   Ht 5\' 7"  (1.702 m)   Wt 166 lb 1 oz (75.3 kg)   SpO2 98%   BMI 26.01 kg/m   Wt Readings from Last 3 Encounters:  02/24/20 166 lb 1 oz (75.3 kg)  12/23/19 171 lb (77.6 kg)  12/13/19 171 lb 9.6 oz (77.8 kg)      Physical Exam Vitals and nursing note reviewed.  Constitutional:      General: He is not in acute distress.    Appearance: Normal appearance. He is well-developed. He is not ill-appearing.  HENT:     Head: Normocephalic and atraumatic.     Right Ear: Hearing, tympanic membrane, ear canal and external ear normal.     Left Ear: Hearing, tympanic membrane, ear canal and external ear normal.  Eyes:     General: No scleral icterus.    Extraocular Movements: Extraocular movements intact.     Conjunctiva/sclera: Conjunctivae normal.     Pupils: Pupils are equal, round, and reactive to light.  Cardiovascular:     Rate and Rhythm: Normal rate and regular rhythm.  Pulses: Normal pulses.          Radial pulses are 2+ on the right side and 2+ on the left side.     Heart sounds: Normal heart sounds. No murmur.  Pulmonary:     Effort: Pulmonary effort is normal. No respiratory distress.     Breath sounds: Normal breath sounds. No wheezing, rhonchi or rales.  Abdominal:     General: Abdomen is flat. Bowel sounds are normal. There is no distension.     Palpations: Abdomen is soft. There is no mass.     Tenderness: There is no abdominal  tenderness. There is no guarding or rebound.     Hernia: No hernia is present.  Musculoskeletal:        General: Normal range of motion.     Cervical back: Normal range of motion and neck supple.     Right lower leg: No edema.     Left lower leg: No edema.  Lymphadenopathy:     Cervical: No cervical adenopathy.  Skin:    General: Skin is warm and dry.     Findings: No rash.  Neurological:     General: No focal deficit present.     Mental Status: He is alert and oriented to person, place, and time.     Comments: CN grossly intact, station and gait intact  Psychiatric:        Mood and Affect: Mood normal.        Behavior: Behavior normal.        Thought Content: Thought content normal.        Judgment: Judgment normal.       Results for orders placed or performed in visit on 02/18/20  CBC with Differential/Platelet  Result Value Ref Range   WBC 4.8 4.0 - 10.5 K/uL   RBC 4.63 4.22 - 5.81 Mil/uL   Hemoglobin 14.6 13.0 - 17.0 g/dL   HCT 42.7 39.0 - 52.0 %   MCV 92.3 78.0 - 100.0 fl   MCHC 34.3 30.0 - 36.0 g/dL   RDW 12.4 11.5 - 15.5 %   Platelets 236.0 150.0 - 400.0 K/uL   Neutrophils Relative % 54.8 43.0 - 77.0 %   Lymphocytes Relative 31.1 12.0 - 46.0 %   Monocytes Relative 9.0 3.0 - 12.0 %   Eosinophils Relative 4.1 0.0 - 5.0 %   Basophils Relative 1.0 0.0 - 3.0 %   Neutro Abs 2.6 1.4 - 7.7 K/uL   Lymphs Abs 1.5 0.7 - 4.0 K/uL   Monocytes Absolute 0.4 0.1 - 1.0 K/uL   Eosinophils Absolute 0.2 0.0 - 0.7 K/uL   Basophils Absolute 0.0 0.0 - 0.1 K/uL  Comprehensive metabolic panel  Result Value Ref Range   Sodium 137 135 - 145 mEq/L   Potassium 4.3 3.5 - 5.1 mEq/L   Chloride 106 96 - 112 mEq/L   CO2 28 19 - 32 mEq/L   Glucose, Bld 93 70 - 99 mg/dL   BUN 14 6 - 23 mg/dL   Creatinine, Ser 0.93 0.40 - 1.50 mg/dL   Total Bilirubin 0.5 0.2 - 1.2 mg/dL   Alkaline Phosphatase 55 39 - 117 U/L   AST 16 0 - 37 U/L   ALT 23 0 - 53 U/L   Total Protein 6.5 6.0 - 8.3 g/dL    Albumin 4.3 3.5 - 5.2 g/dL   GFR 86.90 >60.00 mL/min   Calcium 9.0 8.4 - 10.5 mg/dL  Lipid panel  Result Value Ref Range  Cholesterol 151 0 - 200 mg/dL   Triglycerides 53.0 0.0 - 149.0 mg/dL   HDL 45.40 >39.00 mg/dL   VLDL 10.6 0.0 - 40.0 mg/dL   LDL Cholesterol 95 0 - 99 mg/dL   Total CHOL/HDL Ratio 3    NonHDL 105.87    Assessment & Plan:  This visit occurred during the SARS-CoV-2 public health emergency.  Safety protocols were in place, including screening questions prior to the visit, additional usage of staff PPE, and extensive cleaning of exam room while observing appropriate contact time as indicated for disinfecting solutions.   Problem List Items Addressed This Visit    Health maintenance examination - Primary    Preventative protocols reviewed and updated unless pt declined. Discussed healthy diet and lifestyle.        Other Visit Diagnoses    Need for Tdap vaccination       Relevant Orders   Tdap vaccine greater than or equal to 7yo IM (Completed)       No orders of the defined types were placed in this encounter.  Orders Placed This Encounter  Procedures  . Tdap vaccine greater than or equal to 7yo IM    Patient instructions: Tdap today  Colesterol y azucar bien controlados hoy.  Regresar en 1 ao para proximo examen fisico. Siga dieta saludable y ejercicio regular.  Gusto verlo hoy.   Follow up plan: Return in about 1 year (around 02/23/2021) for annual exam, prior fasting for blood work.  Ria Bush, MD

## 2020-02-24 NOTE — Patient Instructions (Addendum)
Tdap today  Colesterol y azucar bien controlados hoy.  Regresar en 1 ao para proximo examen fisico. Siga dieta saludable y ejercicio regular.  Gusto verlo hoy.   Mantenimiento de Teacher, English as a foreign language, Male Adoptar un estilo de vida saludable y recibir atencin preventiva son importantes para promover la salud y Musician. Consulte al mdico sobre:  El esquema adecuado para hacerse pruebas y exmenes peridicos.  Cosas que puede hacer por su cuenta para prevenir enfermedades y [place] sano. Qu debo saber sobre la dieta, el peso y el ejercicio? Consuma una dieta saludable   Consuma una dieta que incluya muchas verduras, frutas, productos lcteos con bajo contenido de [place] y Advertising account planner.  No consuma muchos alimentos ricos en grasas slidas, azcares agregados o sodio. Mantenga un peso saludable El ndice de masa muscular [facility]) es una medida que puede utilizarse para identificar posibles problemas de [place]. Proporciona una estimacin de la grasa corporal basndose en el peso y la altura. Su mdico puede ayudarle a Radiation protection practitioner [place] y a Scientist, forensic o Theatre manager un peso saludable. Haga ejercicio con regularidad Haga ejercicio con regularidad. Esta es una de las prcticas ms importantes que puede hacer por su salud. La mayora de los adultos deben seguir estas pautas:  Optometrist, al menos, 1101minutos de actividad fsica por semana. El ejercicio debe aumentar la frecuencia cardaca y Nature conservation officer transpirar (ejercicio de intensidad moderada).  Hacer ejercicios de fortalecimiento por lo [employer] por semana. Agregue esto a su plan de ejercicio de intensidad moderada.  Pasar menos tiempo sentados. Incluso la actividad fsica ligera puede ser beneficiosa. Controle sus niveles de colesterol y lpidos en la sangre Comience a realizarse anlisis de lpidos y Research officer, trade union en la sangre a los 20aos y luego reptalos cada 5aos. Es posible que Automotive engineer los  niveles de colesterol con mayor frecuencia si:  Sus niveles de lpidos y colesterol son altos.  Es mayor de 40aos.  Presenta un alto riesgo de padecer enfermedades cardacas. Qu debo saber sobre las pruebas de deteccin del cncer? Muchos tipos de cncer pueden detectarse de manera temprana y, a menudo, pueden prevenirse. Segn su historia clnica y sus antecedentes familiares, es posible que deba realizarse pruebas de deteccin del cncer en diferentes edades. Esto puede incluir pruebas de deteccin de lo siguiente:  Surveyor, minerals.  Cncer de prstata.  Cncer de piel.  Cncer de pulmn. Qu debo saber sobre la enfermedad cardaca, la diabetes y la hipertensin arterial? Presin arterial y enfermedad cardaca  La hipertensin arterial causa enfermedades cardacas y [place] el riesgo de accidente cerebrovascular. Es ms probable que esto se manifieste en las personas que tienen lecturas de presin arterial alta, tienen ascendencia africana o tienen sobrepeso.  Hable con el mdico sobre sus valores de presin arterial deseados.  Hgase controlar la presin arterial: ? Cada 3 a 5 aos si tiene entre 18 y 82 aos. ? Todos los aos si es mayor de [place].  Si tiene entre 59 y 45 aos y es fumador o Insurance account manager, pregntele al mdico si debe realizarse una prueba de deteccin de aneurisma artico abdominal (AAA) por nica vez. Diabetes Realcese exmenes de deteccin de la diabetes con regularidad. Este anlisis revisa el nivel de azcar en la sangre en [place]. Hgase las pruebas de deteccin:  Cada tresaos despus de los 22aos de edad si tiene un peso normal y un bajo riesgo de padecer diabetes.  Con ms frecuencia y a partir de [place] edad inferior si tiene  sobrepeso o un alto riesgo de padecer diabetes. Qu debo saber sobre la prevencin de infecciones? Hepatitis B Si tiene un riesgo ms alto de contraer hepatitis B, debe someterse a un examen de deteccin de este virus.  Hable con el mdico para averiguar si tiene riesgo de contraer la infeccin por hepatitis B. Hepatitis C Se recomienda un anlisis de [place] para:  Todos los que nacieron entre 1945 y [phone].  Todas las personas que tengan un riesgo de haber contrado hepatitis C. Enfermedades de transmisin sexual (ETS)  Debe realizarse pruebas de deteccin de ITS todos los aos, incluidas la gonorrea y la clamidia, si: ? Es sexualmente activo y es menor de 24aos. ? Es mayor de 24aos, y Investment banker, operational informa que corre riesgo de tener este tipo de infecciones. ? La actividad sexual ha cambiado desde que le hicieron la ltima prueba de deteccin y tiene un riesgo mayor de Best boy clamidia o Radio broadcast assistant. Pregntele al mdico si usted tiene riesgo.  Pregntele al mdico si usted tiene un alto riesgo de Museum/gallery curator VIH. El mdico tambin puede recomendarle un medicamento recetado para ayudar a evitar la infeccin por el VIH. Si elige tomar medicamentos para prevenir el VIH, primero debe [employer] de deteccin del VIH. Luego debe hacerse anlisis cada 48meses mientras est tomando los medicamentos. Siga estas instrucciones en su casa: Estilo de vida  No consuma ningn producto que contenga nicotina o tabaco, como cigarrillos, cigarrillos electrnicos y tabaco de Higher education careers adviser. Si necesita ayuda para dejar de fumar, consulte al mdico.  No consuma drogas.  No comparta agujas.  Solicite ayuda a su mdico si necesita apoyo o informacin para abandonar las drogas. Consumo de alcohol  No beba alcohol si el mdico se lo prohbe.  Si bebe alcohol: ? Limite la cantidad que consume de 0 a 2 medidas por da. ? Est atento a la cantidad de alcohol que hay en las bebidas que toma. En los [place], una medida equivale a una botella de cerveza de 12oz (372ml), un vaso de vino de 5oz (1109ml) o un vaso de una bebida alcohlica de alta graduacin de 1oz (57ml). Instrucciones generales  Realcese los estudios de  rutina de la salud, dentales y de Public librarian.  [place].  Infrmele a su mdico si: ? Se siente deprimido con frecuencia. ? Alguna vez ha sido vctima de [place] o no se siente seguro en su casa. Resumen  Adoptar un estilo de vida saludable y recibir atencin preventiva son importantes para promover la salud y Musician.  Siga las instrucciones del mdico acerca de una dieta saludable, el ejercicio y la realizacin de pruebas o exmenes para Engineer, building services.  Siga las instrucciones del mdico con respecto al control del colesterol y la presin arterial. Esta informacin no tiene Marine scientist el consejo del mdico. Asegrese de hacerle al mdico cualquier pregunta que tenga. Document Revised: 10/07/2018 Document Reviewed: 10/07/2018 Elsevier Patient Education  [place].

## 2020-02-24 NOTE — Assessment & Plan Note (Signed)
Preventative protocols reviewed and updated unless pt declined. Discussed healthy diet and lifestyle.  

## 2020-10-28 ENCOUNTER — Encounter: Payer: Self-pay | Admitting: Family Medicine

## 2020-11-06 ENCOUNTER — Encounter: Payer: Self-pay | Admitting: Family Medicine

## 2021-02-12 DIAGNOSIS — Z03818 Encounter for observation for suspected exposure to other biological agents ruled out: Secondary | ICD-10-CM | POA: Diagnosis not present

## 2021-02-15 ENCOUNTER — Telehealth: Payer: Self-pay

## 2021-02-15 NOTE — Telephone Encounter (Addendum)
Noted. Agree.  First day of symptoms 02/12/2021. Positive test 02/15/2021.  He is low risk so doesn't need antiviral treatment.  See below for other recommendations:  Update [place] on Monday with how he's feeling.  Let us know right away if any worsening shortness of breath or persistent productive cough or fever.   Follow these current CDC guidelines for self-isolation: - Stay home for 5 days, starting the day after your symptoms (The first day is really day 0). - If you have no symptoms or your symptoms are resolving after 5 days, you can leave your house. - Continue to wear a mask around others for 5 additional days. **If you have a fever, continue to stay home until your fever resolves for 24 hours without fever-reducing medications.**  Here are some of the supportive therapies that may help with symptoms of a COVID-19 infection: A. Zinc Lozenge 75 to 100mg  daily B. Vitamin C 500mg  twice a day C. Vitamin D (Cholecalciferol) 2000 IU daily ----------- For family, friends, or co-workers that may have been exposed to you up to 2 days prior to your symptoms: IF they have been boosted: OR Completed the primary series of Pfizer or Moderna vaccine within the last 6 months OR Completed the primary series of J&J vaccine within the last 2 months THEN: - They should wear a mask around others for 10 days. - They can test on day 5, if possible.  No earlier though. - If they develop symptoms, get a test immediately and stay home.  IF they: Completed the primary series of Pfizer or Moderna vaccine over 6 months ago and are not boosted OR Completed the primary series of J&J over 2 months ago and are not boosted OR Are unvaccinated THEN: - They should stay home for 5 days. After that, continue to wear a mask around others for 5 additional days. - If they can't quarantine, they must wear a mask for 10 days. - They can test on day 5, if possible.  No earlier though. - If they develop symptoms, get  a test immediately and stay home.

## 2021-02-15 NOTE — Telephone Encounter (Signed)
Patient called and stated that he tested positive for COVID with an at home test today and wanted to know what he should do. Patient began having symptoms on Monday and completed a PCR test, which was negative. However, today his symptoms had not improved, so that is why he completed the at home test. Patient reports having mild symptoms of runny nose, cough, and body aches. Patient denied SOB, chest pain, fever, chills, N/V/D or other acute symptoms. Instructed patient to stay hydrated, eat a well balanced meal, rest, and quarantine. Patient verbalized understanding. UC and ED precautions given.

## 2021-02-16 NOTE — Telephone Encounter (Signed)
Spoke with pt relaying Dr. Synthia Innocent message.  Pt verbalizes understanding.  Says he will get son to pick up supplements and will call back Mon with an update.

## 2021-02-17 ENCOUNTER — Other Ambulatory Visit: Payer: Self-pay | Admitting: Family Medicine

## 2021-02-17 DIAGNOSIS — Z1159 Encounter for screening for other viral diseases: Secondary | ICD-10-CM

## 2021-02-17 DIAGNOSIS — Z13220 Encounter for screening for lipoid disorders: Secondary | ICD-10-CM

## 2021-02-17 DIAGNOSIS — U071 COVID-19: Secondary | ICD-10-CM

## 2021-02-17 HISTORY — DX: COVID-19: U07.1

## 2021-02-20 ENCOUNTER — Other Ambulatory Visit: Payer: 59

## 2021-02-23 ENCOUNTER — Other Ambulatory Visit (INDEPENDENT_AMBULATORY_CARE_PROVIDER_SITE_OTHER): Payer: 59

## 2021-02-23 ENCOUNTER — Other Ambulatory Visit: Payer: Self-pay

## 2021-02-23 ENCOUNTER — Other Ambulatory Visit: Payer: 59

## 2021-02-23 DIAGNOSIS — U071 COVID-19: Secondary | ICD-10-CM | POA: Diagnosis not present

## 2021-02-23 DIAGNOSIS — Z13220 Encounter for screening for lipoid disorders: Secondary | ICD-10-CM

## 2021-02-23 DIAGNOSIS — Z1159 Encounter for screening for other viral diseases: Secondary | ICD-10-CM | POA: Diagnosis not present

## 2021-02-23 LAB — CBC WITH DIFFERENTIAL/PLATELET
Basophils Absolute: 0 10*3/uL (ref 0.0–0.1)
Basophils Relative: 0.7 % (ref 0.0–3.0)
Eosinophils Absolute: 0 10*3/uL (ref 0.0–0.7)
Eosinophils Relative: 1.1 % (ref 0.0–5.0)
HCT: 43.7 % (ref 39.0–52.0)
Hemoglobin: 15.2 g/dL (ref 13.0–17.0)
Lymphocytes Relative: 29.1 % (ref 12.0–46.0)
Lymphs Abs: 1.2 10*3/uL (ref 0.7–4.0)
MCHC: 34.8 g/dL (ref 30.0–36.0)
MCV: 90.9 fl (ref 78.0–100.0)
Monocytes Absolute: 0.3 10*3/uL (ref 0.1–1.0)
Monocytes Relative: 8 % (ref 3.0–12.0)
Neutro Abs: 2.6 10*3/uL (ref 1.4–7.7)
Neutrophils Relative %: 61.1 % (ref 43.0–77.0)
Platelets: 279 10*3/uL (ref 150.0–400.0)
RBC: 4.81 Mil/uL (ref 4.22–5.81)
RDW: 12.7 % (ref 11.5–15.5)
WBC: 4.2 10*3/uL (ref 4.0–10.5)

## 2021-02-23 LAB — BASIC METABOLIC PANEL
BUN: 13 mg/dL (ref 6–23)
CO2: 28 mEq/L (ref 19–32)
Calcium: 9.1 mg/dL (ref 8.4–10.5)
Chloride: 105 mEq/L (ref 96–112)
Creatinine, Ser: 0.87 mg/dL (ref 0.40–1.50)
GFR: 102.02 mL/min (ref >60.00)
Glucose, Bld: 93 mg/dL (ref 70–99)
Potassium: 4.2 mEq/L (ref 3.5–5.1)
Sodium: 139 mEq/L (ref 135–145)

## 2021-02-23 LAB — LIPID PANEL
Cholesterol: 161 mg/dL (ref 0–200)
HDL: 42.5 mg/dL (ref >39.00)
LDL Cholesterol: 107 mg/dL — ABNORMAL HIGH (ref 0–99)
NonHDL: 118.45
Total CHOL/HDL Ratio: 4
Triglycerides: 57 mg/dL (ref 0.0–149.0)
VLDL: 11.4 mg/dL (ref 0.0–40.0)

## 2021-02-27 ENCOUNTER — Telehealth: Payer: Self-pay | Admitting: Family Medicine

## 2021-02-27 ENCOUNTER — Encounter: Payer: 59 | Admitting: Family Medicine

## 2021-02-27 LAB — HEPATITIS C ANTIBODY
Hepatitis C Ab: NONREACTIVE
SIGNAL TO CUT-OFF: 0.01 (ref <1.00)

## 2021-02-27 NOTE — Telephone Encounter (Signed)
Pt missed CPE appt today. plz call to reschedule.  Get update for COVID symptoms from mid May - he never called with update.

## 2021-02-27 NOTE — Telephone Encounter (Signed)
Spoke pt notifying him he missed today's CPE.  Pt states he forgot about his appt and apologizes.    I will get front office to r/s CPE for 03/06/21 at 8:00.    Also, pt says he's doing fine.  Had COVID test over weekend for peace of mind.  Results, neg.

## 2021-03-01 ENCOUNTER — Telehealth: Payer: Self-pay | Admitting: Family Medicine

## 2021-03-01 NOTE — Telephone Encounter (Signed)
Attempted to reach patient. Lvm asking him to call office. If he returns call, please transfer to me.

## 2021-03-01 NOTE — Telephone Encounter (Signed)
Patient returned your call. Please call back. EM

## 2021-03-02 NOTE — Telephone Encounter (Signed)
patient was rescheduled for his CPE visit that was missed in May.

## 2021-03-02 NOTE — Telephone Encounter (Signed)
See other phone note, patient was rescheduled for his CPE visit that was missed in May.

## 2021-03-09 ENCOUNTER — Ambulatory Visit (INDEPENDENT_AMBULATORY_CARE_PROVIDER_SITE_OTHER): Payer: 59 | Admitting: Family Medicine

## 2021-03-09 ENCOUNTER — Encounter: Payer: Self-pay | Admitting: Family Medicine

## 2021-03-09 ENCOUNTER — Other Ambulatory Visit: Payer: Self-pay

## 2021-03-09 VITALS — BP 116/68 | HR 60 | Temp 97.7°F | Ht 67.5 in | Wt 168.0 lb

## 2021-03-09 DIAGNOSIS — M25562 Pain in left knee: Secondary | ICD-10-CM

## 2021-03-09 DIAGNOSIS — Z0000 Encounter for general adult medical examination without abnormal findings: Secondary | ICD-10-CM

## 2021-03-09 NOTE — Assessment & Plan Note (Signed)
Preventative protocols reviewed and updated unless pt declined. Discussed healthy diet and lifestyle.  

## 2021-03-09 NOTE — Assessment & Plan Note (Signed)
Normal exam today.  Suggested he return when in active flare for evaluation.

## 2021-03-09 NOTE — Progress Notes (Signed)
Patient ID: Marcus [name], male    DOB: [birth year], 50 y.o.   MRN: [id number]  This visit was conducted in person.  BP 116/68   Pulse 60   Temp 97.7 F (36.5 C) (Temporal)   Ht 5' 7.5" (1.715 m)   Wt 168 lb (76.2 kg)   SpO2 97%   BMI 25.92 kg/m    CC: CPE Subjective:   HPI: Marcus [name] is a 50 y.o. male presenting on 03/09/2021 for Annual Exam   COVID infection 01/2021. Still with mild mucous production in the morning. Otherwise fully recovered.   L knee discomfort limits running - points to anterior knee, worse with running. No locking. Some instability. Denies inciting trauma/injury or falls. He did play soccer a lot growing up. Ongoing discomfort for 7-8 months.   Preventative: COLONOSCOPY 11/2019 - 2 small polyps, int hem, repeat 7 yrs ([facility]) Flu shot - yearly [place] 11/2019, 12/2019, booster 08/2020 Td 2010, Tdap 01/2020 Seat belt use discussed Sunscreen use discussed. Denies changing moles on skin.  Non smoker  Alcohol - occasional on weekends, socially. Dentist q6 mo  Eye exam yearly   Spanish speaking, from [place], [place]  Lives with wife and 2 sons and Marcus [name]  Occupation: works with logistics Edu: some college ([employer], broadcasting/film/video) Activity: walking 2-3 mi/day  Diet: good water, fruits/vegetables daily      Relevant past medical, surgical, family and social history reviewed and updated as indicated. Interim medical history since our last visit reviewed. Allergies and medications reviewed and updated. No outpatient medications prior to visit.   No facility-administered medications prior to visit.     Per HPI unless specifically indicated in ROS section below Review of Systems  Constitutional:  Negative for activity change, appetite change, chills, fatigue, fever and unexpected weight change.  HENT:  Negative for hearing loss.   Eyes:  Negative for visual disturbance.  Respiratory:  Positive for cough (recent covid). Negative for  chest tightness, shortness of breath and wheezing.   Cardiovascular:  Negative for chest pain, palpitations and leg swelling.  Gastrointestinal:  Positive for diarrhea (s/p cholecystectomy). Negative for abdominal distention, abdominal pain, blood in stool, constipation, nausea and vomiting.       Hemorrhoids  Genitourinary:  Negative for difficulty urinating and hematuria.  Musculoskeletal:  Negative for arthralgias, myalgias and neck pain.  Skin:  Negative for rash.  Neurological:  Positive for headaches (recent covid). Negative for dizziness, seizures and syncope.  Hematological:  Negative for adenopathy. Does not bruise/bleed easily.  Psychiatric/Behavioral:  Negative for dysphoric mood. The patient is not nervous/anxious.   Objective:  BP 116/68   Pulse 60   Temp 97.7 F (36.5 C) (Temporal)   Ht 5' 7.5" (1.715 m)   Wt 168 lb (76.2 kg)   SpO2 97%   BMI 25.92 kg/m   Wt Readings from Last 3 Encounters:  03/09/21 168 lb (76.2 kg)  02/24/20 166 lb 1 oz (75.3 kg)  12/23/19 171 lb (77.6 kg)      Physical Exam Vitals and nursing note reviewed.  Constitutional:      General: He is not in acute distress.    Appearance: Normal appearance. He is well-developed. He is not ill-appearing.  HENT:     Head: Normocephalic and atraumatic.     Right Ear: Hearing, tympanic membrane, ear canal and external ear normal.     Left Ear: Hearing, tympanic membrane, ear canal and external ear normal.  Eyes:  General: No scleral icterus.    Extraocular Movements: Extraocular movements intact.     Conjunctiva/sclera: Conjunctivae normal.     Pupils: Pupils are equal, round, and reactive to light.  Neck:     Thyroid: No thyroid mass or thyromegaly.  Cardiovascular:     Rate and Rhythm: Normal rate and regular rhythm.     Pulses: Normal pulses.          Radial pulses are 2+ on the right side and 2+ on the left side.     Heart sounds: Normal heart sounds. No murmur heard. Pulmonary:     Effort:  Pulmonary effort is normal. No respiratory distress.     Breath sounds: Normal breath sounds. No wheezing, rhonchi or rales.  Abdominal:     General: Bowel sounds are normal. There is no distension.     Palpations: Abdomen is soft. There is no mass.     Tenderness: There is no abdominal tenderness. There is no guarding or rebound.     Hernia: No hernia is present.  Musculoskeletal:        General: Normal range of motion.     Cervical back: Normal range of motion and neck supple.     Right lower leg: No edema.     Left lower leg: No edema.     Comments:  R knee WNL L knee exam: No deformity on inspection. No pain with palpation of knee landmarks. No effusion/swelling noted. FROM in flex/extension without crepitus. No popliteal fullness. Neg drawer test. Neg mcmurray test. No pain with valgus/varus stress. No PFgrind. No abnormal patellar mobility.   Lymphadenopathy:     Cervical: No cervical adenopathy.  Skin:    General: Skin is warm and dry.     Findings: No rash.  Neurological:     General: No focal deficit present.     Mental Status: He is alert and oriented to person, place, and time.  Psychiatric:        Mood and Affect: Mood normal.        Behavior: Behavior normal.        Thought Content: Thought content normal.        Judgment: Judgment normal.      Results for orders placed or performed in visit on 02/23/21  Hepatitis C antibody  Result Value Ref Range   Hepatitis C Ab NON-REACTIVE NON-REACTIVE   SIGNAL TO CUT-OFF 0.01 <4.09  Basic metabolic panel  Result Value Ref Range   Sodium 139 135 - 145 mEq/L   Potassium 4.2 3.5 - 5.1 mEq/L   Chloride 105 96 - 112 mEq/L   CO2 28 19 - 32 mEq/L   Glucose, Bld 93 70 - 99 mg/dL   BUN 13 6 - 23 mg/dL   Creatinine, Ser 0.87 0.40 - 1.50 mg/dL   GFR 102.02 >60.00 mL/min   Calcium 9.1 8.4 - 10.5 mg/dL  CBC with Differential/Platelet  Result Value Ref Range   WBC 4.2 4.0 - 10.5 K/uL   RBC 4.81 4.22 - 5.81 Mil/uL    Hemoglobin 15.2 13.0 - 17.0 g/dL   HCT 43.7 39.0 - 52.0 %   MCV 90.9 78.0 - 100.0 fl   MCHC 34.8 30.0 - 36.0 g/dL   RDW 12.7 11.5 - 15.5 %   Platelets 279.0 150.0 - 400.0 K/uL   Neutrophils Relative % 61.1 43.0 - 77.0 %   Lymphocytes Relative 29.1 12.0 - 46.0 %   Monocytes Relative 8.0 3.0 - 12.0 %  Eosinophils Relative 1.1 0.0 - 5.0 %   Basophils Relative 0.7 0.0 - 3.0 %   Neutro Abs 2.6 1.4 - 7.7 K/uL   Lymphs Abs 1.2 0.7 - 4.0 K/uL   Monocytes Absolute 0.3 0.1 - 1.0 K/uL   Eosinophils Absolute 0.0 0.0 - 0.7 K/uL   Basophils Absolute 0.0 0.0 - 0.1 K/uL  Lipid panel  Result Value Ref Range   Cholesterol 161 0 - 200 mg/dL   Triglycerides 57.0 0.0 - 149.0 mg/dL   HDL 42.50 >39.00 mg/dL   VLDL 11.4 0.0 - 40.0 mg/dL   LDL Cholesterol 107 (H) 0 - 99 mg/dL   Total CHOL/HDL Ratio 4    NonHDL 118.45    Assessment & Plan:  This visit occurred during the SARS-CoV-2 public health emergency.  Safety protocols were in place, including screening questions prior to the visit, additional usage of staff PPE, and extensive cleaning of exam room while observing appropriate contact time as indicated for disinfecting solutions.   Problem List Items Addressed This Visit     Health maintenance examination - Primary    Preventative protocols reviewed and updated unless pt declined. Discussed healthy diet and lifestyle.        Left anterior knee pain    Normal exam today.  Suggested he return when in active flare for evaluation.           No orders of the defined types were placed in this encounter.  No orders of the defined types were placed in this encounter.   Patient instructions: Gusto verlo hoy.  Regresar en 1 ao para proximo examen fisico.   Follow up plan: Return in about 1 year (around 03/09/2022) for annual exam, prior fasting for blood work.  Ria Bush, MD

## 2021-03-09 NOTE — Patient Instructions (Signed)
Gusto verlo hoy.  Regresar en 1 ao para proximo examen fisico Mantenimiento de Teacher, English as a foreign language, Male Adoptar un estilo de vida saludable y recibir atencin preventiva son importantes para promover la salud y Musician. Consulte al mdico sobre: El esquema adecuado para hacerse pruebas y exmenes peridicos. Cosas que puede hacer por su cuenta para prevenir enfermedades y Floydada sano. Qu debo saber sobre la dieta, el peso y el ejercicio? Consuma una dieta saludable  Consuma una dieta que incluya muchas verduras, frutas, productos lcteos con bajo contenido de [place] y Advertising account planner. No consuma muchos alimentos ricos en grasas slidas, azcares agregados o sodio.  Mantenga un peso saludable El ndice de masa muscular [facility]) es una medida que puede utilizarse para identificar posibles problemas de [place]. Proporciona una estimacin de la grasa corporal basndose en el peso y la altura. Su mdico puede ayudarle Soil scientist [place] y a Scientist, forensic o Theatre manager un peso saludable. Haga ejercicio con regularidad Haga ejercicio con regularidad. Esta es una de las prcticas ms importantes que puede hacer por su salud. La [employer] de los adultos deben seguir estas pautas: Optometrist, al menos, 150 minutos de actividad fsica por semana. El ejercicio debe aumentar la frecuencia cardaca y Nature conservation officer transpirar (ejercicio de intensidad moderada). Hacer ejercicios de fortalecimiento por lo [employer] por semana. Agregue esto a su plan de ejercicio de intensidad moderada. Pasar menos tiempo sentados. Incluso la actividad fsica ligera puede ser beneficiosa. Controle sus niveles de colesterol y lpidos en la sangre Comience a realizarse anlisis de lpidos y colesterol en la sangre a los20 aos y luego reptalos cada 5 aos. Es posible que Automotive engineer los niveles de colesterol con mayor frecuencia si: Sus niveles de lpidos y colesterol son altos. Es mayor de 23  aos. Presenta un alto riesgo de padecer enfermedades cardacas. Qu debo saber sobre las pruebas de deteccin del cncer? Muchos tipos de cncer pueden detectarse de manera temprana y, a menudo, pueden prevenirse. Segn su historia clnica y sus antecedentes familiares, es posible que deba realizarse pruebas de deteccin del cncer en diferentes edades. Esto puede incluir pruebas de deteccin de lo siguiente: Surveyor, minerals. Cncer de prstata. Cncer de piel. Cncer de pulmn. Qu debo saber sobre la enfermedad cardaca, la diabetes y la hipertensinarterial? Presin arterial y enfermedad cardaca La hipertensin arterial causa enfermedades cardacas y [place] el riesgo de accidente cerebrovascular. Es ms probable que esto se manifieste en las personas que tienen lecturas de presin arterial alta, tienen ascendencia africana o tienen sobrepeso. Hable con el mdico sobre sus valores de presin arterial deseados. Hgase controlar la presin arterial: Cada 3 a 5 aos si tiene entre 18 y 59 aos. Todos los aos si es mayor de 40 aos. Si tiene entre 44 y 43 aos y es fumador o Insurance account manager, pregntele al mdico si debe realizarse una prueba de deteccin de aneurisma artico abdominal (AAA) por nica vez. Diabetes Realcese exmenes de deteccin de la diabetes con regularidad. Este anlisis revisa el nivel de azcar en la sangre en [place]. Hgase las pruebas de deteccin: Cada tres aos despus de los 49 aos de edad si tiene un peso normal y un bajo riesgo de padecer diabetes. Con ms frecuencia y a partir de [place] edad inferior si tiene sobrepeso o un alto riesgo de padecer diabetes. Qu debo saber sobre la prevencin de infecciones? Hepatitis B Si tiene un riesgo ms alto de contraer hepatitis B, debe someterse a Barista de deteccin  de este virus. Hable con el mdico para averiguar si tiene riesgode contraer la infeccin por hepatitis B. Hepatitis C Se recomienda un anlisis de [place]  para: Todos los que nacieron entre 1945 y [phone]. Todas las personas que tengan un riesgo de haber contrado hepatitis C. Enfermedades de transmisin sexual (ETS) Debe realizarse pruebas de deteccin de ITS todos los aos, incluidas la gonorrea y la clamidia, si: Es sexualmente activo y es menor de 35 aos. Es mayor de 98 aos, y Investment banker, operational informa que corre riesgo de tener este tipo de infecciones. La actividad sexual ha cambiado desde que le hicieron la ltima prueba de deteccin y tiene un riesgo mayor de Best boy clamidia o Radio broadcast assistant. Pregntele al mdico si usted tiene riesgo. Pregntele al mdico si usted tiene un alto riesgo de Museum/gallery curator VIH. El mdico tambin puede recomendarle un medicamento recetado para ayudar a evitar la infeccin por el VIH. Si elige tomar medicamentos para prevenir el VIH, primero debe [employer] de deteccin del VIH. Luego debe hacerse anlisis cada 3 meses mientras est tomando los medicamentos. Siga estas instrucciones en su casa: Estilo de vida No consuma ningn producto que contenga nicotina o tabaco, como cigarrillos, cigarrillos electrnicos y tabaco de Higher education careers adviser. Si necesita ayuda para dejar de fumar, consulte al mdico. No consuma drogas. No comparta agujas. Solicite ayuda a su mdico si necesita apoyo o informacin para abandonar las drogas. Consumo de alcohol No beba alcohol si el mdico se lo prohbe. Si bebe alcohol: Limite la cantidad que consume de 0 a 2 medidas por da. Est atento a la cantidad de alcohol que hay en las bebidas que toma. En los [place], una medida equivale a una botella de cerveza de 12 oz (355 ml), un vaso de vino de 5 oz (148 ml) o un vaso de una bebida alcohlica de alta graduacin de 1 oz (44 ml). Instrucciones generales Realcese los estudios de rutina de la salud, dentales y de Public librarian. [place]. Infrmele a su mdico si: Se siente deprimido con frecuencia. Alguna vez ha sido vctima de  [place] o no se siente seguro en su casa. Resumen Adoptar un estilo de vida saludable y recibir atencin preventiva son importantes para promover la salud y Musician. Siga las instrucciones del mdico acerca de una dieta saludable, el ejercicio y la realizacin de pruebas o exmenes para Engineer, building services. Siga las instrucciones del mdico con respecto al control del colesterol y la presin arterial. Esta informacin no tiene Marine scientist el consejo del mdico. Asegresede hacerle al mdico cualquier pregunta que tenga. Document Revised: 10/07/2018 Document Reviewed: 10/07/2018 Elsevier Patient Education  [place].

## 2021-11-24 ENCOUNTER — Emergency Department: Payer: 59

## 2021-11-24 ENCOUNTER — Emergency Department
Admission: EM | Admit: 2021-11-24 | Discharge: 2021-11-24 | Disposition: A | Discharge status: Home / Self Care | Payer: 59 | Attending: Emergency Medicine | Admitting: Emergency Medicine

## 2021-11-24 ENCOUNTER — Other Ambulatory Visit: Payer: Self-pay

## 2021-11-24 DIAGNOSIS — S61210A Laceration without foreign body of right index finger without damage to nail, initial encounter: Secondary | ICD-10-CM | POA: Insufficient documentation

## 2021-11-24 DIAGNOSIS — Z23 Encounter for immunization: Secondary | ICD-10-CM | POA: Insufficient documentation

## 2021-11-24 DIAGNOSIS — S6991XA Unspecified injury of right wrist, hand and finger(s), initial encounter: Secondary | ICD-10-CM | POA: Diagnosis present

## 2021-11-24 DIAGNOSIS — W271XXA Contact with garden tool, initial encounter: Secondary | ICD-10-CM | POA: Diagnosis not present

## 2021-11-24 MED ORDER — NAPROXEN 500 MG PO TABS: 500.0000 mg | ORAL_TABLET | Freq: Two times a day (BID) | ORAL | Status: DC

## 2021-11-24 MED ORDER — SULFAMETHOXAZOLE-TRIMETHOPRIM 800-160 MG PO TABS
1.0000 | ORAL_TABLET | Freq: Once | ORAL | Status: AC
  Administered 2021-11-24: 1 via ORAL
  Filled 2021-11-24: qty 1

## 2021-11-24 MED ORDER — BACITRACIN-NEOMYCIN-POLYMYXIN 400-5-5000 EX OINT
TOPICAL_OINTMENT | Freq: Once | CUTANEOUS | Status: AC
  Filled 2021-11-24: qty 1

## 2021-11-24 MED ORDER — TETANUS-DIPHTH-ACELL PERTUSSIS 5-2.5-18.5 LF-MCG/0.5 IM SUSY
0.5000 mL | PREFILLED_SYRINGE | Freq: Once | INTRAMUSCULAR | Status: AC
  Administered 2021-11-24: 0.5 mL via INTRAMUSCULAR
  Filled 2021-11-24: qty 0.5

## 2021-11-24 MED ORDER — SULFAMETHOXAZOLE-TRIMETHOPRIM 800-160 MG PO TABS: 1.0000 | ORAL_TABLET | Freq: Two times a day (BID) | ORAL | 0 refills | Status: DC

## 2021-11-24 MED ORDER — LIDOCAINE HCL (PF) 1 % IJ SOLN
5.0000 mL | Freq: Once | INTRAMUSCULAR | Status: AC
  Administered 2021-11-24: 5 mL
  Filled 2021-11-24: qty 5

## 2021-11-24 MED ORDER — NAPROXEN 500 MG PO TABS
500.0000 mg | ORAL_TABLET | Freq: Once | ORAL | Status: AC
  Administered 2021-11-24: 500 mg via ORAL
  Filled 2021-11-24: qty 1

## 2021-11-24 NOTE — ED Triage Notes (Signed)
Pt was tuning up lawn mower and sliced right index finger at 2nd knuckle. Laceration is about 1.5 inches long. Clean pressure dressing applied to stop bleeding. Pt states no other injuries.

## 2021-11-24 NOTE — ED Provider Notes (Signed)
[facility] Provider Note    Event Date/Time   First MD Initiated Contact with Patient 11/24/21 1609     (approximate)   History   Laceration   HPI  Marcus [name] is a 51 y.o. male presents for laceration to the dorsal aspect of the second digit right dominant hand.  Patient was cut by  lawnmower blade.  Bleeding is controlled direct pressure.  Patient denies loss sensation.  Patient states mild discomfort with extension of the finger.  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     Physical Exam   Triage Vital Signs: ED Triage Vitals  Enc Vitals Group     BP 11/24/21 1518 115/82     Pulse Rate 11/24/21 1518 74     Resp 11/24/21 1518 18     Temp 11/24/21 1518 98.4 F (36.9 C)     Temp Source 11/24/21 1518 Oral     SpO2 11/24/21 1518 99 %     Weight 11/24/21 1519 174 lb (78.9 kg)     Height 11/24/21 1519 5\' 6"  (1.676 m)     Head Circumference --      Peak Flow --      Pain Score 11/24/21 1518 2     Pain Loc --      Pain Edu? --      Excl. in [place]? --     Most recent vital signs: Vitals:   11/24/21 1518  BP: 115/82  Pulse: 74  Resp: 18  Temp: 98.4 F (36.9 C)  SpO2: 99%     General: Awake, no distress.  CV:  Good peripheral perfusion.  Resp:  Normal effort.  Abd:  No distention.  Other:  Laceration dorsal aspect of second digit right hand.   ED Results / Procedures / Treatments   Labs (all labs ordered are listed, but only abnormal results are displayed) Labs Reviewed - No data to display   EKG     RADIOLOGY No acute findings on x-ray.  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 Radiology confirmed no fracture or dislocation.   PROCEDURES:  Critical Care performed: No  ..Laceration Repair  Date/Time: 11/24/2021 5:13 PM Performed by: Sable Feil, PA-C Authorized by: Sable Feil, PA-C   Consent:    Consent obtained:  Verbal   Consent given by:   Patient   Risks discussed:  Infection, pain and tendon damage Universal protocol:    Procedure explained and questions answered to patient or proxy's satisfaction: yes     Relevant documents present and verified: yes     Imaging studies available: yes     Required blood products, implants, devices, and special equipment available: yes     Immediately prior to procedure, a time out was called: yes     Patient identity confirmed:  Verbally with patient Anesthesia:    Anesthesia method:  Nerve block   Block needle gauge:  25 G   Block anesthetic:  Lidocaine 1% w/o epi   Block injection procedure:  Anatomic landmarks identified, incremental injection and anatomic landmarks palpated   Block outcome:  Anesthesia achieved Laceration details:    Location:  Finger   Finger location:  R index finger   Length (cm):  2   Depth (mm):  2 Pre-procedure details:    Preparation:  Patient was prepped and draped in usual sterile fashion and imaging obtained to evaluate for foreign bodies Exploration:  Limited defect created (wound extended): no     Hemostasis achieved with:  Direct pressure   Wound exploration: wound explored through full range of motion and entire depth of wound visualized     Contaminated: yes   Treatment:    Area cleansed with:  Povidone-iodine and saline   Amount of cleaning:  Extensive   Irrigation solution:  Sterile saline   Irrigation method:  Syringe   Debridement:  None   Undermining:  None   Scar revision: no   Skin repair:    Repair method:  Sutures   Suture size:  4-0   Suture material:  Nylon   Suture technique:  Simple interrupted   Number of sutures:  8 Approximation:    Approximation:  Close Repair type:    Repair type:  Simple Post-procedure details:    Dressing:  Antibiotic ointment, non-adherent dressing, splint for protection and sterile dressing   Procedure completion:  Tolerated Comments:     Discussed concern for tendon injury with no visible  separation.   MEDICATIONS ORDERED IN ED: Medications  neomycin-bacitracin-polymyxin (NEOSPORIN) ointment packet (has no administration in time range)  sulfamethoxazole-trimethoprim (BACTRIM DS) 800-160 MG per tablet 1 tablet (has no administration in time range)  naproxen (NAPROSYN) tablet 500 mg (has no administration in time range)  Tdap (BOOSTRIX) injection 0.5 mL (has no administration in time range)  lidocaine (PF) (XYLOCAINE) 1 % injection 5 mL (5 mLs Other Given 11/24/21 1633)     IMPRESSION / MDM / ASSESSMENT AND PLAN / ED COURSE  I reviewed the triage vital signs and the nursing notes.                              Differential diagnosis includes, but is not limited to, finger fracture versus subluxation.  Extensor tendon injury.    FINAL CLINICAL IMPRESSION(S) / ED DIAGNOSES   Final diagnoses:  Laceration of right index finger without damage to nail, foreign body presence unspecified, initial encounter     Rx / DC Orders   ED Discharge Orders          Ordered    sulfamethoxazole-trimethoprim (BACTRIM DS) 800-160 MG tablet  2 times daily        11/24/21 1709    naproxen (NAPROSYN) 500 MG tablet  2 times daily with meals        11/24/21 1709             Note:  This document was prepared using Dragon voice recognition software and may include unintentional dictation errors.    Sable Feil, PA-C 11/24/21 1718    Carrie Mew, MD 11/25/21 [phone]

## 2021-11-24 NOTE — ED Notes (Signed)
Ointment applied to R index finger. Non-adherent sterile dressing applied to R index finger. Splint applied to R index finger. Pt instructed on how to clean, dress, and apply splint to said finger.

## 2021-11-24 NOTE — Discharge Instructions (Signed)
X-ray reveals no bony abnormality or dislocation.  Mild suspicion for extensor tendon injury.  Keep finger splint and follow discharge care instructions.  Take medication as directed.

## 2021-11-26 DIAGNOSIS — S61210A Laceration without foreign body of right index finger without damage to nail, initial encounter: Secondary | ICD-10-CM | POA: Diagnosis not present

## 2021-12-04 DIAGNOSIS — S61210A Laceration without foreign body of right index finger without damage to nail, initial encounter: Secondary | ICD-10-CM | POA: Diagnosis not present

## 2022-02-22 ENCOUNTER — Ambulatory Visit (INDEPENDENT_AMBULATORY_CARE_PROVIDER_SITE_OTHER): Payer: 59 | Admitting: Family Medicine

## 2022-02-22 ENCOUNTER — Encounter: Payer: Self-pay | Admitting: Family Medicine

## 2022-02-22 VITALS — BP 116/78 | HR 75 | Temp 97.9°F | Ht 66.0 in | Wt 168.1 lb

## 2022-02-22 DIAGNOSIS — K219 Gastro-esophageal reflux disease without esophagitis: Secondary | ICD-10-CM | POA: Diagnosis not present

## 2022-02-22 DIAGNOSIS — Z8719 Personal history of other diseases of the digestive system: Secondary | ICD-10-CM

## 2022-02-22 DIAGNOSIS — R1013 Epigastric pain: Secondary | ICD-10-CM

## 2022-02-22 LAB — CBC WITH DIFFERENTIAL/PLATELET
Basophils Absolute: 0 10*3/uL (ref 0.0–0.1)
Basophils Relative: 0.9 % (ref 0.0–3.0)
Eosinophils Absolute: 0 10*3/uL (ref 0.0–0.7)
Eosinophils Relative: 0.8 % (ref 0.0–5.0)
HCT: 44.5 % (ref 39.0–52.0)
Hemoglobin: 15.2 g/dL (ref 13.0–17.0)
Lymphocytes Relative: 20.9 % (ref 12.0–46.0)
Lymphs Abs: 1.1 10*3/uL (ref 0.7–4.0)
MCHC: 34.1 g/dL (ref 30.0–36.0)
MCV: 92.5 fl (ref 78.0–100.0)
Monocytes Absolute: 0.3 10*3/uL (ref 0.1–1.0)
Monocytes Relative: 6.6 % (ref 3.0–12.0)
Neutro Abs: 3.6 10*3/uL (ref 1.4–7.7)
Neutrophils Relative %: 70.8 % (ref 43.0–77.0)
Platelets: 286 10*3/uL (ref 150.0–400.0)
RBC: 4.81 Mil/uL (ref 4.22–5.81)
RDW: 12.8 % (ref 11.5–15.5)
WBC: 5 10*3/uL (ref 4.0–10.5)

## 2022-02-22 LAB — COMPREHENSIVE METABOLIC PANEL
ALT: 20 U/L (ref 0–53)
AST: 15 U/L (ref 0–37)
Albumin: 4.5 g/dL (ref 3.5–5.2)
Alkaline Phosphatase: 58 U/L (ref 39–117)
BUN: 14 mg/dL (ref 6–23)
CO2: 28 mEq/L (ref 19–32)
Calcium: 9.6 mg/dL (ref 8.4–10.5)
Chloride: 103 mEq/L (ref 96–112)
Creatinine, Ser: 0.89 mg/dL (ref 0.40–1.50)
GFR: 100.62 mL/min (ref >60.00)
Glucose, Bld: 81 mg/dL (ref 70–99)
Potassium: 4.2 mEq/L (ref 3.5–5.1)
Sodium: 137 mEq/L (ref 135–145)
Total Bilirubin: 0.7 mg/dL (ref 0.2–1.2)
Total Protein: 7.3 g/dL (ref 6.0–8.3)

## 2022-02-22 LAB — LIPASE: Lipase: 39 U/L (ref 11.0–59.0)

## 2022-02-22 MED ORDER — OMEPRAZOLE 40 MG PO CPDR: 40.0000 mg | DELAYED_RELEASE_CAPSULE | Freq: Every day | ORAL | 3 refills | Status: DC

## 2022-02-22 NOTE — Patient Instructions (Addendum)
Laboratorios hoy.  Creo que tiene gastritis de nuevo. Tome omeprazole '40mg'$  diarios 30 minutos antes de la comida grande del dia. Tome por 3 semanas luego como necesite. Si sigue doloe de estomago, pare omeprazole por 2-3 semanas y revisaremis prueba de heces para H pylori.   Suba cabeza de la cama.  Avoidance of citrus, fatty foods, chocolate, peppermint, and excessive alcohol, along with sodas, orange juice (acidic drinks) At least a few hours between dinner and bed, minimize naps after eating.   Gastritis en adultos Gastritis, Adult La gastritis es la inflamacin del estmago. Hay dos tipos de gastritis: Gastritis aguda. Este tipo aparece de manera repentina. Gastritis crnica. Este tipo es mucho ms frecuente. Se desarrolla lentamente y dura un largo tiempo. La gastritis se manifiesta cuando el revestimiento del estmago se debilita o se lesiona. Sin tratamiento, la gastritis puede causar sangrado y lceras estomacales. Cules son las causas? Esta afeccin puede ser causada por lo siguiente: Una infeccin. Beber alcohol en exceso. Ciertos medicamentos. Estos incluyen corticoesteroides, antibiticos y algunos medicamentos de venta sin receta, como aspirina o ibuprofeno. Tener demasiado cido [employer]. Tener una enfermedad del estmago. Algunas otras causas son las siguientes: Nurse, mental health. Algunos tratamientos para el cncer (radiacin). Fumar cigarrillos o usar productos que contengan nicotina o tabaco. En algunos casos, se desconoce la causa de esta afeccin. Qu incrementa el riesgo? Tener una enfermedad de los intestinos. Tener una enfermedad por la cual el propio sistema inmunitario ataca el organismo (enfermedad autoinmunitaria), como la enfermedad de Crohn. Usar aspirina o ibuprofeno y otros antiinflamatorios no esteroideos (AINE) para tratar otras afecciones, como enfermedades cardacas o dolor crnico. Psychologist, forensic. Cules son los signos o sntomas? Los sntomas  de esta afeccin incluyen los siguientes: Dolor o sensacin de ardor en la parte superior del abdomen. Nuseas. Vmitos. Sensacin molesta de saciedad despus de comer. Prdida de peso. Mal aliento. Sangre en el vmito o en la materia fecal (heces). En algunos casos, no hay sntomas. Cmo se diagnostica? Esta afeccin se puede diagnosticar en funcin de sus antecedentes mdicos, un examen fsico y pruebas. Las pruebas pueden incluir las siguientes: Sus antecedentes mdicos y Ardelia Mems descripcin de sus sntomas. Un examen fsico. Pruebas. Estas pueden incluir las siguientes: Pruebas de [place]. Pruebas de heces. Una prueba en la que se introduce un instrumento delgado y flexible con Ardelia Mems luz y Ardelia Mems pequea cmara a travs del esfago hasta el estmago (endoscopa alta). Una prueba en la que se toma [place] de tejido para analizarla con un microscopio (biopsia). Cmo se trata? Esta afeccin se puede tratar con medicamentos. Los medicamentos que se utilizan varan segn la causa de la gastritis. Si la afeccin se debe a una infeccin bacteriana, pueden recetarle medicamentos antibiticos. Si la afeccin se debe al exceso de cido estomacal, se pueden administrar medicamentos denominados bloqueadores H2, inhibidores de la bomba de protones o anticidos. El tratamiento tambin puede incluir interrumpir el uso de ciertos medicamentos como aspirina o ibuprofeno y otros antiinflamatorios no esteroideos (AINE). Siga estas instrucciones en su casa: Medicamentos Use los medicamentos de venta libre y los recetados solamente como se lo haya indicado el mdico. Si le recetaron un antibitico, tmelo como se lo haya indicado el mdico. No deje de tomar el antibitico aunque comience a sentirse mejor. Consumo de alcohol No beba alcohol si: Su mdico le indica no hacerlo. Est embarazada, puede estar embarazada o est tratando de [place]. Si bebe alcohol: Limite su uso a las siguientes  medidas: De 0  a 1 medida por da para las mujeres. De 0 a 2 medidas por da para los hombres. Sepa cunta cantidad de alcohol hay en las bebidas que toma. En los [place], una medida equivale a una botella de cerveza de 12 oz (355 ml), un vaso de vino de 5 oz (148 ml) o un vaso de una bebida alcohlica de alta graduacin de 1 oz (44 ml). Instrucciones generales  Haga comidas pequeas y frecuentes Medical sales representative de comidas abundantes. Evite los alimentos y las bebidas que intensifican los sntomas. Hable con el mdico [employer] formas de controlar el estrs, Aeronautical engineer ejercicio con regularidad o practicar respiracin profunda, meditacin o yoga. No consuma ningn producto que contenga nicotina o tabaco. Estos productos incluyen cigarrillos, tabaco para Higher education careers adviser y aparatos de vapeo, como los Psychologist, sport and exercise. Si necesita ayuda para dejar de fumar, consulte al mdico. Beba suficiente lquido como para mantener la orina de color amarillo plido. Concurra a [place]. Esto es importante. Comunquese con un mdico si: Sus sntomas empeoran. El dolor abdominal empeora. Los sntomas regresan despus del tratamiento. Tiene fiebre. Solicite ayuda de inmediato si: Vomita sangre o una sustancia parecida a los posos del caf. Las heces son negras o de color rojo oscuro. No puede retener los lquidos. Estos sntomas pueden representar un problema grave que constituye Engineer, maintenance (IT). No espere a ver si los sntomas desaparecen. Solicite atencin mdica de inmediato. Comunquese con el servicio de emergencias de su localidad (911 en los [place]). No conduzca por sus propios medios [employer] hospital. Resumen La gastritis es la inflamacin del revestimiento del estmago que puede ocurrir de Geographical information systems officer repentina [place]) o desarrollarse lentamente con el tiempo (crnica). Esta afeccin se diagnostica mediante los antecedentes mdicos, un examen fsico o  pruebas. Esta afeccin puede tratarse con medicamentos para tratar la infeccin o medicamentos para reducir la cantidad de Chartered loss adjuster. Siga las instrucciones del mdico acerca de tomar medicamentos, hacer cambios en la dieta y saber cundo pedir ayuda. Esta informacin no tiene Marine scientist el consejo del mdico. Asegrese de hacerle al mdico cualquier pregunta que tenga. Document Revised: 02/15/2021 Document Reviewed: 02/15/2021 Elsevier Patient Education  [place].

## 2022-02-22 NOTE — Assessment & Plan Note (Signed)
No significant GERD sxs today.  Start omeprazole '40mg'$  daily x 3 wks then PRN.

## 2022-02-22 NOTE — Progress Notes (Signed)
Patient ID: Marcus [name], male    DOB: 04/11/1971, 51 y.o.   MRN: [id number]  This visit was conducted in person.  BP 116/78   Pulse 75   Temp 97.9 F (36.6 C) (Temporal)   Ht '5\' 6"'$  (1.676 m)   Wt 168 lb 2 oz (76.3 kg)   SpO2 97%   BMI 27.14 kg/m    CC: epigastric abd pain  Subjective:   HPI: Marcus [name] is a 51 y.o. male presenting on 02/22/2022 for Abdominal Pain (C/o abd pain in epigastric region, comes and goes.  Started 1 wk ago. Denies N/V/D.)   1 wk h/o upper abdominal/epigastric discomfort, worse at night, managed with milk. Notes some bloating.  No typical GERD symptoms.  No fevers/chills, nausea/vomiting, diarrhea, blood in stool. No boring pain to back.  He's started taking prilosec '20mg'$  daily for past 3 days with some benefit.  Notes increased stress at work recently.  Does eat spicy foods, does drink alcohol on weekends.  He's recently backed off coffee, alcohol due to discomfort - with limited benefit.  No known tick bite.   H/o chronic intermittent epigastric discomfort previously diagnosed as  gastritis (while in [place]). He did have endoscopy while in [place], dx duodenal ulcer, previously treated with ranitidine. He was also tested for H pylori while in [place] - doesn't remember if he tested positive or received treatment.   H/o gallstone pancreatitis 05/2014 s/p cholecystectomy and ERCP retrieval of intraductal stone. Current pain is not as severe.      Relevant past medical, surgical, family and social history reviewed and updated as indicated. Interim medical history since our last visit reviewed. Allergies and medications reviewed and updated. Outpatient Medications Prior to Visit  Medication Sig Dispense Refill   naproxen (NAPROSYN) 500 MG tablet Take 1 tablet (500 mg total) by mouth 2 (two) times daily with a meal. 20 tablet 00   sulfamethoxazole-trimethoprim (BACTRIM DS) 800-160 MG tablet Take 1 tablet by mouth 2 (two) times daily. 20 tablet 0   No  facility-administered medications prior to visit.     Per HPI unless specifically indicated in ROS section below Review of Systems  Objective:  BP 116/78   Pulse 75   Temp 97.9 F (36.6 C) (Temporal)   Ht '5\' 6"'$  (1.676 m)   Wt 168 lb 2 oz (76.3 kg)   SpO2 97%   BMI 27.14 kg/m   Wt Readings from Last 3 Encounters:  02/22/22 168 lb 2 oz (76.3 kg)  11/24/21 174 lb (78.9 kg)  03/09/21 168 lb (76.2 kg)      Physical Exam Vitals and nursing note reviewed.  Constitutional:      Appearance: Normal appearance. He is well-developed. He is not ill-appearing.  Cardiovascular:     Rate and Rhythm: Normal rate and regular rhythm.     Pulses: Normal pulses.     Heart sounds: Normal heart sounds. No murmur heard. Pulmonary:     Effort: Pulmonary effort is normal. No respiratory distress.     Breath sounds: Normal breath sounds. No wheezing, rhonchi or rales.  Abdominal:     General: Bowel sounds are normal. There is no distension.     Palpations: Abdomen is soft. There is no mass.     Tenderness: There is no abdominal tenderness. There is no right CVA tenderness, left CVA tenderness, guarding or rebound.     Hernia: No hernia is present.  Musculoskeletal:     Right lower leg: No edema.  Left lower leg: No edema.  Skin:    General: Skin is warm and dry.     Findings: No rash.  Neurological:     Mental Status: He is alert.  Psychiatric:        Mood and Affect: Mood normal.        Behavior: Behavior normal.       Assessment & Plan:   Problem List Items Addressed This Visit     History of duodenal ulcer   GERD (gastroesophageal reflux disease)    No significant GERD sxs today.  Start omeprazole '40mg'$  daily x 3 wks then PRN.        Relevant Medications   omeprazole (PRILOSEC) 40 MG capsule   Epigastric pain - Primary    Story suspicious for recurrent gastritis in h/o same.  Rx omeprazole '40mg'$  daily x3 wks then PRN. Measures for dietary control reviewed. Update if not  improving with treatment.  In h/o pancreatitis, check lipase today.  He does have h/o H pylori however he's recently been taking omeprazole so stool Ag test would be less reliable. Will check this if ongoing symptoms despite above.        Relevant Orders   Comprehensive metabolic panel   CBC with Differential/Platelet   Lipase     Meds ordered this encounter  Medications   omeprazole (PRILOSEC) 40 MG capsule    Sig: Take 1 capsule (40 mg total) by mouth daily. For 3 wks then as needed for GERD symptoms    Dispense:  30 capsule    Refill:  3   Orders Placed This Encounter  Procedures   Comprehensive metabolic panel   CBC with Differential/Platelet   Lipase     Patient instructions: Laboratorios hoy.  Creo que tiene gastritis de nuevo. Tome omeprazole '40mg'$  diarios 30 minutos antes de la comida grande del dia. Tome por 3 semanas luego como necesite. Si sigue doloe de estomago, pare omeprazole por 2-3 semanas y revisaremis prueba de heces para H pylori.   Suba cabeza de la cama.  Avoidance of citrus, fatty foods, chocolate, peppermint, and excessive alcohol, along with sodas, orange juice (acidic drinks) At least a few hours between dinner and bed, minimize naps after eating.  Follow up plan: Return if symptoms worsen or fail to improve.  Ria Bush, MD

## 2022-02-22 NOTE — Assessment & Plan Note (Signed)
Story suspicious for recurrent gastritis in h/o same.  Rx omeprazole '40mg'$  daily x3 wks then PRN. Measures for dietary control reviewed. Update if not improving with treatment.  In h/o pancreatitis, check lipase today.  He does have h/o H pylori however he's recently been taking omeprazole so stool Ag test would be less reliable. Will check this if ongoing symptoms despite above.

## 2022-03-07 ENCOUNTER — Other Ambulatory Visit: Payer: Self-pay | Admitting: Family Medicine

## 2022-03-07 DIAGNOSIS — E785 Hyperlipidemia, unspecified: Secondary | ICD-10-CM

## 2022-03-08 ENCOUNTER — Other Ambulatory Visit: Payer: 59

## 2022-03-15 ENCOUNTER — Encounter: Payer: 59 | Admitting: Family Medicine

## 2022-04-08 ENCOUNTER — Encounter: Payer: Self-pay | Admitting: Family Medicine

## 2022-04-08 MED ORDER — HYDROXYZINE HCL 25 MG PO TABS: 25.0000 mg | ORAL_TABLET | Freq: Two times a day (BID) | ORAL | 0 refills | Status: DC | PRN

## 2022-04-08 MED ORDER — TRIAMCINOLONE ACETONIDE 0.1 % EX CREA: 1.0000 | TOPICAL_CREAM | Freq: Two times a day (BID) | CUTANEOUS | 0 refills | Status: DC

## 2022-06-03 ENCOUNTER — Other Ambulatory Visit: Payer: Self-pay | Admitting: Family Medicine

## 2022-06-06 ENCOUNTER — Other Ambulatory Visit (INDEPENDENT_AMBULATORY_CARE_PROVIDER_SITE_OTHER): Payer: 59

## 2022-06-06 DIAGNOSIS — E785 Hyperlipidemia, unspecified: Secondary | ICD-10-CM

## 2022-06-06 LAB — LIPID PANEL
Cholesterol: 168 mg/dL (ref 0–200)
HDL: 47.6 mg/dL (ref >39.00)
LDL Cholesterol: 109 mg/dL — ABNORMAL HIGH (ref 0–99)
NonHDL: 120.14
Total CHOL/HDL Ratio: 4
Triglycerides: 54 mg/dL (ref 0.0–149.0)
VLDL: 10.8 mg/dL (ref 0.0–40.0)

## 2022-06-06 LAB — TSH: TSH: 1.88 u[IU]/mL (ref 0.35–5.50)

## 2022-06-10 ENCOUNTER — Encounter: Payer: Self-pay | Admitting: Family Medicine

## 2022-06-10 ENCOUNTER — Ambulatory Visit (INDEPENDENT_AMBULATORY_CARE_PROVIDER_SITE_OTHER): Payer: 59 | Admitting: Family Medicine

## 2022-06-10 VITALS — BP 120/72 | HR 67 | Temp 97.7°F | Ht 66.75 in | Wt 168.1 lb

## 2022-06-10 DIAGNOSIS — K219 Gastro-esophageal reflux disease without esophagitis: Secondary | ICD-10-CM | POA: Diagnosis not present

## 2022-06-10 DIAGNOSIS — M25562 Pain in left knee: Secondary | ICD-10-CM | POA: Diagnosis not present

## 2022-06-10 DIAGNOSIS — Z0000 Encounter for general adult medical examination without abnormal findings: Secondary | ICD-10-CM | POA: Diagnosis not present

## 2022-06-10 DIAGNOSIS — Z23 Encounter for immunization: Secondary | ICD-10-CM

## 2022-06-10 NOTE — Patient Instructions (Addendum)
Flu shot today  Trate rodillera para mas soporte de rodilla izquierda. Dejeme saber si sigue molestandole.  Gusto verlo hoy. Regresar en 1 ao para proximo examen fisico.  Mantenimiento de Teacher, English as a foreign language, Male Adoptar un estilo de vida saludable y recibir atencin preventiva son importantes para promover la salud y Musician. Consulte al mdico sobre: El esquema adecuado para hacerse pruebas y exmenes peridicos. Cosas que puede hacer por su cuenta para prevenir enfermedades y [place] sano. Qu debo saber sobre la dieta, el peso y el ejercicio? Consuma una dieta saludable  Consuma una dieta que incluya muchas verduras, frutas, productos lcteos con bajo contenido de [place] y Advertising account planner. No consuma muchos alimentos ricos en grasas slidas, azcares agregados o sodio. Mantenga un peso saludable El ndice de masa muscular [facility]) es una medida que puede utilizarse para identificar posibles problemas de [place]. Proporciona una estimacin de la grasa corporal basndose en el peso y la altura. Su mdico puede ayudarle a Radiation protection practitioner [place] y a Scientist, forensic o Theatre manager un peso saludable. Haga ejercicio con regularidad Haga ejercicio con regularidad. Esta es una de las prcticas ms importantes que puede hacer por su salud. La [employer] de los adultos deben seguir estas pautas: Optometrist, al menos, 150 minutos de actividad fsica por semana. El ejercicio debe aumentar la frecuencia cardaca y Nature conservation officer transpirar (ejercicio de intensidad moderada). Hacer ejercicios de fortalecimiento por lo [employer] por semana. Agregue esto a su plan de ejercicio de intensidad moderada. Pase menos tiempo sentado. Incluso la actividad fsica ligera puede ser beneficiosa. Controle sus niveles de colesterol y lpidos en la sangre Comience a realizarse anlisis de lpidos y Research officer, trade union en la sangre a los 63 aos y luego reptalos cada 5 aos. Es posible que Automotive engineer los niveles de  colesterol con mayor frecuencia si: Sus niveles de lpidos y colesterol son altos. Es mayor de 60 aos. Presenta un alto riesgo de padecer enfermedades cardacas. Qu debo saber sobre las pruebas de deteccin del cncer? Muchos tipos de cncer pueden detectarse de manera temprana y, a menudo, pueden prevenirse. Segn su historia clnica y sus antecedentes familiares, es posible que deba realizarse pruebas de deteccin del cncer en diferentes edades. Esto puede incluir pruebas de deteccin de lo siguiente: Surveyor, minerals. Cncer de prstata. Cncer de piel. Cncer de pulmn. Qu debo saber sobre la enfermedad cardaca, la diabetes y la hipertensin arterial? Presin arterial y enfermedad cardaca La hipertensin arterial causa enfermedades cardacas y [place] el riesgo de accidente cerebrovascular. Es ms probable que esto se manifieste en las personas que tienen lecturas de presin arterial alta o tienen sobrepeso. Hable con el mdico sobre sus valores de presin arterial deseados. Hgase controlar la presin arterial: Cada 3 a 5 aos si tiene entre 18 y 87 aos. Todos los aos si es mayor de 40 aos. Si tiene entre 30 y 71 aos y es fumador o Insurance account manager, pregntele al mdico si debe realizarse una prueba de deteccin de aneurisma artico abdominal (AAA) por nica vez. Diabetes Realcese exmenes de deteccin de la diabetes con regularidad. Este anlisis revisa el nivel de azcar en la sangre en [place]. Hgase las pruebas de deteccin: Cada tres aos despus de los 70 aos de edad si tiene un peso normal y un bajo riesgo de padecer diabetes. Con ms frecuencia y a partir de [place] edad inferior si tiene sobrepeso o un alto riesgo de padecer diabetes. Qu debo saber sobre la prevencin de infecciones? Hepatitis B  Si tiene un riesgo ms alto de Museum/gallery curator hepatitis B, debe someterse a un examen de deteccin de este virus. Hable con el mdico para averiguar si tiene riesgo de contraer la  infeccin por hepatitis B. Hepatitis C Se recomienda un anlisis de [place] para: Todos los que nacieron entre 1945 y [id number]. Todas las personas que tengan un riesgo de haber contrado hepatitis C. Enfermedades de transmisin sexual (ETS) Debe realizarse pruebas de deteccin de ITS todos los aos, incluidas la gonorrea y la clamidia, si: Es sexualmente activo y es menor de 78 aos. Es mayor de 28 aos, y Investment banker, operational informa que corre riesgo de tener este tipo de infecciones. La actividad sexual ha cambiado desde que le hicieron la ltima prueba de deteccin y tiene un riesgo mayor de Best boy clamidia o Radio broadcast assistant. Pregntele al mdico si usted tiene riesgo. Pregntele al mdico si usted tiene un alto riesgo de Museum/gallery curator VIH. El mdico tambin puede recomendarle un medicamento recetado para ayudar a evitar la infeccin por el VIH. Si elige tomar medicamentos para prevenir el VIH, primero debe [employer] de deteccin del VIH. Luego debe hacerse anlisis cada 3 meses mientras est tomando los medicamentos. Siga estas indicaciones en su casa: Consumo de alcohol No beba alcohol si el mdico se lo prohbe. Si bebe alcohol: Limite la cantidad que consume de 0 a 2 bebidas por da. Sepa cunta cantidad de alcohol hay en las bebidas que toma. En los [place], una medida equivale a una botella de cerveza de 12 oz (355 ml), un vaso de vino de 5 oz (148 ml) o un vaso de una bebida alcohlica de alta graduacin de 1 oz (44 ml). Estilo de vida No consuma ningn producto que contenga nicotina o tabaco. Estos productos incluyen cigarrillos, tabaco para Higher education careers adviser y aparatos de vapeo, como los Psychologist, sport and exercise. Si necesita ayuda para dejar de consumir estos productos, consulte al mdico. No consuma drogas. No comparta agujas. Solicite ayuda a su mdico si necesita apoyo o informacin para abandonar las drogas. Indicaciones generales Realcese los estudios de rutina de la salud, dentales y de [place]. [place]. Infrmele a su mdico si: Se siente deprimido con frecuencia. Alguna vez ha sido vctima de [place] o no se siente seguro en su casa. Resumen Adoptar un estilo de vida saludable y recibir atencin preventiva son importantes para promover la salud y Musician. Siga las instrucciones del mdico acerca de una dieta saludable, el ejercicio y la realizacin de pruebas o exmenes para Engineer, building services. Siga las instrucciones del mdico con respecto al control del colesterol y la presin arterial. Esta informacin no tiene Marine scientist el consejo del mdico. Asegrese de hacerle al mdico cualquier pregunta que tenga. Document Revised: 02/21/2021 Document Reviewed: 02/21/2021 Elsevier Patient Education  [place].

## 2022-06-10 NOTE — Assessment & Plan Note (Addendum)
Reassuring exam. rec knee brace PRN. Update if ongoing symptoms to consider baseline xrays.

## 2022-06-10 NOTE — Assessment & Plan Note (Signed)
Preventative protocols reviewed and updated unless pt declined. Discussed healthy diet and lifestyle.  

## 2022-06-10 NOTE — Assessment & Plan Note (Signed)
Did well after short omeprazole '40mg'$  course, now symptoms have resolved off medication.

## 2022-06-10 NOTE — Progress Notes (Addendum)
Patient ID: Marcus [name], male    DOB: 1971/02/17, 51 y.o.   MRN: [id number]  This visit was conducted in person.  BP 120/72   Pulse 67   Temp 97.7 F (36.5 C) (Temporal)   Ht 5' 6.75" (1.695 m)   Wt 168 lb 2 oz (76.3 kg)   SpO2 98%   BMI 26.53 kg/m    CC: CPE Subjective:   HPI: Marcus [name] is a 51 y.o. male presenting on 06/10/2022 for Annual Exam   Seen 01/2031 with epigastric pain, concerning for gastritis in h/o same - treated with omeprazole '40mg'$  3 wk course with resolution of symptoms.   L anterior knee pain - ongoing pain, comes and goes. Sometimes difficulty with full extension. No inciting trauma/injury or falls.    Preventative: COLONOSCOPY 11/2019 - 2 small polyps, int hem, repeat 7 yrs ([facility]) Prostate cancer screening - start age 40.  Flu shot - yearly Lucas 11/2019, 12/2019, booster 08/2020 Td 2010, Tdap 01/2020, again 2023  Seat belt use discussed Sunscreen use discussed. Denies changing moles on skin.  Sleep - averaging 7-8 hours/night  Non smoker  Alcohol - occasional on weekends, socially. 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 Interim medical history since our last visit reviewed. Allergies and medications reviewed and updated. Outpatient Medications Prior to Visit  Medication Sig Dispense Refill   hydrOXYzine (ATARAX) 25 MG tablet Take 1 tablet (25 mg total) by mouth 2 (two) times daily as needed for itching. 30 tablet 0   omeprazole (PRILOSEC) 40 MG capsule Take 1 capsule (40 mg total) by mouth daily. For 3 wks then as needed for GERD symptoms 30 capsule 3   triamcinolone cream (KENALOG) 0.1 % Apply 1  Application topically 2 (two) times daily. 80 g 0   No facility-administered medications prior to visit.     Per HPI unless specifically indicated in ROS section below Review of Systems  Constitutional:  Negative for activity change, appetite change, chills, fatigue, fever and unexpected weight change.  HENT:  Negative for hearing loss.   Eyes:  Negative for visual disturbance.  Respiratory:  Negative for cough, chest tightness, shortness of breath and wheezing.   Cardiovascular:  Negative for chest pain, palpitations and leg swelling.  Gastrointestinal:  Negative for abdominal distention, abdominal pain, blood in stool, constipation, diarrhea, nausea and vomiting.  Genitourinary:  Negative for difficulty urinating and hematuria.  Musculoskeletal:  Negative for arthralgias, myalgias and neck pain.  Skin:  Negative for rash.  Neurological:  Negative for dizziness, seizures, syncope and headaches.  Hematological:  Negative for adenopathy. Does not bruise/bleed easily.  Psychiatric/Behavioral:  Negative for dysphoric mood. The patient is not nervous/anxious.     Objective:  BP 120/72   Pulse 67   Temp 97.7 F (36.5 C) (Temporal)   Ht 5' 6.75" (1.695 m)   Wt 168 lb 2 oz (76.3 kg)   SpO2 98%   BMI 26.53 kg/m   Wt Readings from Last 3 Encounters:  06/10/22 168 lb 2 oz (76.3 kg)  02/22/22 168 lb 2 oz (76.3 kg)  11/24/21 174 lb (78.9 kg)  Physical Exam Vitals and nursing note reviewed.  Constitutional:      General: He is not in acute distress.    Appearance: Normal appearance. He is well-developed. He is not ill-appearing.  HENT:     Head: Normocephalic and atraumatic.     Right Ear: Hearing, tympanic membrane, ear canal and external ear normal.     Left Ear: Hearing, tympanic membrane, ear canal and external ear normal.  Eyes:     General: No scleral icterus.    Extraocular Movements: Extraocular movements intact.     Conjunctiva/sclera: Conjunctivae normal.      Pupils: Pupils are equal, round, and reactive to light.  Neck:     Thyroid: No thyroid mass or thyromegaly.  Cardiovascular:     Rate and Rhythm: Normal rate and regular rhythm.     Pulses: Normal pulses.          Radial pulses are 2+ on the right side and 2+ on the left side.     Heart sounds: Normal heart sounds. No murmur heard. Pulmonary:     Effort: Pulmonary effort is normal. No respiratory distress.     Breath sounds: Normal breath sounds. No wheezing, rhonchi or rales.  Abdominal:     General: Bowel sounds are normal. There is no distension.     Palpations: Abdomen is soft. There is no mass.     Tenderness: There is no abdominal tenderness. There is no guarding or rebound.     Hernia: No hernia is present.  Musculoskeletal:        General: Normal range of motion.     Cervical back: Normal range of motion and neck supple.     Right lower leg: No edema.     Left lower leg: No edema.     Comments:  L knee WNL R knee exam: No deformity on inspection. No pain with palpation of knee landmarks. No effusion/swelling noted. FROM in flex/extension with mild crepitus. No popliteal fullness. Neg drawer test. Neg mcmurray test. No pain with valgus/varus stress. No PFgrind. No abnormal patellar mobility.   Lymphadenopathy:     Cervical: No cervical adenopathy.  Skin:    General: Skin is warm and dry.     Findings: No rash.  Neurological:     General: No focal deficit present.     Mental Status: He is alert and oriented to person, place, and time.  Psychiatric:        Mood and Affect: Mood normal.        Behavior: Behavior normal.        Thought Content: Thought content normal.        Judgment: Judgment normal.       Results for orders placed or performed in visit on 06/06/22  TSH  Result Value Ref Range   TSH 1.88 0.35 - 5.50 uIU/mL  Lipid panel  Result Value Ref Range   Cholesterol 168 0 - 200 mg/dL   Triglycerides 54.0 0.0 - 149.0 mg/dL   HDL 47.60 >39.00 mg/dL    VLDL 10.8 0.0 - 40.0 mg/dL   LDL Cholesterol 109 (H) 0 - 99 mg/dL   Total CHOL/HDL Ratio 4    NonHDL 120.14     Assessment & Plan:   Problem List Items Addressed This Visit     Health maintenance examination - Primary (Chronic)    Preventative protocols reviewed and updated unless pt declined. Discussed healthy diet and lifestyle.       GERD (gastroesophageal reflux  disease)    Did well after short omeprazole '40mg'$  course, now symptoms have resolved off medication.       Left anterior knee pain    Reassuring exam. rec knee brace PRN. Update if ongoing symptoms to consider baseline xrays.       Other Visit Diagnoses     Need for influenza vaccination       Relevant Orders   Flu Vaccine QUAD 78moIM (Fluarix, Fluzone & Alfiuria Quad PF) (Completed)        No orders of the defined types were placed in this encounter.  Orders Placed This Encounter  Procedures   Flu Vaccine QUAD 6103moM (Fluarix, Fluzone & Alfiuria Quad PF)    Patient instructions: Flu shot today  Trate rodillera para mas soporte de rodilla izquierda. Dejeme saber si sigue molestandole.  Gusto verlo hoy. Regresar en 1 ao para proximo examen fisico.  Follow up plan: Return in about 1 year (around 06/11/2023) for annual exam, prior fasting for blood work.  JaRia BushMD

## 2022-07-24 DIAGNOSIS — D3132 Benign neoplasm of left choroid: Secondary | ICD-10-CM | POA: Diagnosis not present

## 2023-05-15 ENCOUNTER — Encounter (INDEPENDENT_AMBULATORY_CARE_PROVIDER_SITE_OTHER): Payer: Self-pay

## 2023-06-08 ENCOUNTER — Other Ambulatory Visit: Payer: Self-pay | Admitting: Family Medicine

## 2023-06-08 DIAGNOSIS — Z125 Encounter for screening for malignant neoplasm of prostate: Secondary | ICD-10-CM

## 2023-06-08 DIAGNOSIS — Z13220 Encounter for screening for lipoid disorders: Secondary | ICD-10-CM

## 2023-06-08 DIAGNOSIS — Z131 Encounter for screening for diabetes mellitus: Secondary | ICD-10-CM

## 2023-06-09 ENCOUNTER — Other Ambulatory Visit (INDEPENDENT_AMBULATORY_CARE_PROVIDER_SITE_OTHER): Payer: 59

## 2023-06-09 DIAGNOSIS — Z136 Encounter for screening for cardiovascular disorders: Secondary | ICD-10-CM

## 2023-06-09 DIAGNOSIS — Z13220 Encounter for screening for lipoid disorders: Secondary | ICD-10-CM | POA: Diagnosis not present

## 2023-06-09 DIAGNOSIS — Z125 Encounter for screening for malignant neoplasm of prostate: Secondary | ICD-10-CM

## 2023-06-09 DIAGNOSIS — Z131 Encounter for screening for diabetes mellitus: Secondary | ICD-10-CM

## 2023-06-09 LAB — BASIC METABOLIC PANEL
BUN: 16 mg/dL (ref 6–23)
CO2: 27 meq/L (ref 19–32)
Calcium: 8.8 mg/dL (ref 8.4–10.5)
Chloride: 105 meq/L (ref 96–112)
Creatinine, Ser: 0.97 mg/dL (ref 0.40–1.50)
GFR: 90.83 mL/min (ref >60.00)
Glucose, Bld: 84 mg/dL (ref 70–99)
Potassium: 4 meq/L (ref 3.5–5.1)
Sodium: 140 meq/L (ref 135–145)

## 2023-06-09 LAB — LIPID PANEL
Cholesterol: 162 mg/dL (ref 0–200)
HDL: 51.8 mg/dL (ref >39.00)
LDL Cholesterol: 99 mg/dL (ref 0–99)
NonHDL: 110.35
Total CHOL/HDL Ratio: 3
Triglycerides: 58 mg/dL (ref 0.0–149.0)
VLDL: 11.6 mg/dL (ref 0.0–40.0)

## 2023-06-09 LAB — PSA: PSA: 1.03 ng/mL (ref 0.10–4.00)

## 2023-06-16 ENCOUNTER — Ambulatory Visit (INDEPENDENT_AMBULATORY_CARE_PROVIDER_SITE_OTHER)
Admission: RE | Admit: 2023-06-16 | Discharge: 2023-06-16 | Disposition: A | Discharge status: Home / Self Care | Payer: 59 | Source: Ambulatory Visit | Attending: Family Medicine | Admitting: Family Medicine

## 2023-06-16 ENCOUNTER — Encounter: Payer: Self-pay | Admitting: Family Medicine

## 2023-06-16 ENCOUNTER — Ambulatory Visit (INDEPENDENT_AMBULATORY_CARE_PROVIDER_SITE_OTHER): Payer: 59 | Admitting: Family Medicine

## 2023-06-16 VITALS — BP 116/68 | HR 63 | Temp 97.6°F | Ht 67.5 in | Wt 164.4 lb

## 2023-06-16 DIAGNOSIS — M25562 Pain in left knee: Secondary | ICD-10-CM

## 2023-06-16 DIAGNOSIS — K921 Melena: Secondary | ICD-10-CM

## 2023-06-16 DIAGNOSIS — Z0000 Encounter for general adult medical examination without abnormal findings: Secondary | ICD-10-CM | POA: Diagnosis not present

## 2023-06-16 DIAGNOSIS — G8929 Other chronic pain: Secondary | ICD-10-CM | POA: Diagnosis not present

## 2023-06-16 NOTE — Assessment & Plan Note (Signed)
Preventative protocols reviewed and updated unless pt declined. Discussed healthy diet and lifestyle.  

## 2023-06-16 NOTE — Assessment & Plan Note (Signed)
Chronic issue, attributed to internal hemorrhoids. Latest colonoscopy overall reassuring. He is interested in further evaluation/treatment of internal hemorroids.  Will refer back to GI.

## 2023-06-16 NOTE — Patient Instructions (Addendum)
Si le interesa, puede hacer cita para nurse visit (visita con enfermera) para vacuna contra flu y vacuna contra shingles.  Rayos x de rodilla izquierda hoy  Trate suplementos que pueden ayudar salud de cartilago - vitamina D 1000 unidades diarias, o glucosamine/chondroitin, o tumeric anti inflamatorio natural - todos sin receta.  Gusto verlo hoy.  Regresar en 1 ao para proximo fisico.

## 2023-06-16 NOTE — Assessment & Plan Note (Signed)
Chronic, present for 1+ year.  Reassuring exam. Suspect osteoarthritis developing.  Check baseline films today  Discussed OTC supplements that may be helpful.

## 2023-06-16 NOTE — Progress Notes (Signed)
Ph: [phone] Fax: [phone]   Patient ID: Marcus [name], male    DOB: Feb 24, 1971, 52 y.o.   MRN: [id number]  This visit was conducted in person.  BP 116/68   Pulse 63   Temp 97.6 F (36.4 C) (Temporal)   Ht 5' 7.5" (1.715 m)   Wt 164 lb 6 oz (74.6 kg)   SpO2 99%   BMI 25.36 kg/m    CC: CPE Subjective:   HPI: Marcus [name] is a 52 y.o. male presenting on 06/16/2023 for Annual Exam   Ongoing left knee pain for 1+ year, worse with physical activity. Points to entire knee. Denies inciting trauma/injury or falls.   Notes intermittent bleeding from internal hemorrhoids - about once a month. Would be interested in further evaluation for this. He likes spicy foods.   Works 2 jobs - Surveyor, minerals side job.   Preventative: COLONOSCOPY 11/2019 - 2 small polyps, int hem, repeat 7 yrs ([facility]) Prostate cancer screening - start age 24. No nocturia.  Lung cancer screen - not eligible  Flu shot - yearly  COVID vaccine - Pfizer 11/2019, 12/2019, booster 08/2020 Td 2010, Tdap 01/2020, again 2023  Shingrix - discussed - would be interested but will return for this Seat belt use discussed  Sunscreen use discussed, sun-shirts as well. Denies changing moles on skin.  Sleep - averaging 7-8 hours/night  Non smoker  Alcohol - occasional on weekends, socially  Dentist q6 mo  Eye exam yearly   Spanish speaking, from [place], [place]  Lives with wife and 2 sons and Binnie Kand  Occupation: works at [employer]  Edu: college - Print production planner  Activity: walking 2-3 mi/day  Diet: good water, fruits/vegetables daily      Relevant past medical, surgical, family and social history reviewed and updated as indicated. Interim medical history since our last visit reviewed. Allergies and medications reviewed and updated. No outpatient medications prior to visit.   No facility-administered medications prior to visit.     Per HPI unless specifically indicated in ROS section below Review  of Systems  Constitutional:  Negative for activity change, appetite change, chills, fatigue, fever and unexpected weight change.  HENT:  Negative for hearing loss.   Eyes:  Negative for visual disturbance.  Respiratory:  Negative for cough, chest tightness, shortness of breath and wheezing.   Cardiovascular:  Negative for chest pain, palpitations and leg swelling.  Gastrointestinal:  Negative for abdominal distention, abdominal pain, blood in stool, constipation, diarrhea, nausea and vomiting.       BRBPR - int hemorrhoid related  Genitourinary:  Negative for difficulty urinating and hematuria.  Musculoskeletal:  Negative for arthralgias, myalgias and neck pain.  Skin:  Negative for rash.  Neurological:  Negative for dizziness, seizures, syncope and headaches.  Hematological:  Negative for adenopathy. Does not bruise/bleed easily.  Psychiatric/Behavioral:  Negative for dysphoric mood. The patient is not nervous/anxious.     Objective:  BP 116/68   Pulse 63   Temp 97.6 F (36.4 C) (Temporal)   Ht 5' 7.5" (1.715 m)   Wt 164 lb 6 oz (74.6 kg)   SpO2 99%   BMI 25.36 kg/m   Wt Readings from Last 3 Encounters:  06/16/23 164 lb 6 oz (74.6 kg)  06/10/22 168 lb 2 oz (76.3 kg)  02/22/22 168 lb 2 oz (76.3 kg)      Physical Exam Vitals and nursing note reviewed.  Constitutional:      General: He is not in acute distress.  Appearance: Normal appearance. He is well-developed. He is not ill-appearing.  HENT:     Head: Normocephalic and atraumatic.     Right Ear: Hearing, tympanic membrane, ear canal and external ear normal.     Left Ear: Hearing, tympanic membrane, ear canal and external ear normal.     Nose: Nose normal.     Mouth/Throat:     Mouth: Mucous membranes are moist.     Pharynx: Oropharynx is clear. No oropharyngeal exudate or posterior oropharyngeal erythema.  Eyes:     General: No scleral icterus.    Extraocular Movements: Extraocular movements intact.      Conjunctiva/sclera: Conjunctivae normal.     Pupils: Pupils are equal, round, and reactive to light.  Neck:     Thyroid: No thyroid mass or thyromegaly.  Cardiovascular:     Rate and Rhythm: Normal rate and regular rhythm.     Pulses: Normal pulses.          Radial pulses are 2+ on the right side and 2+ on the left side.     Heart sounds: Normal heart sounds. No murmur heard. Pulmonary:     Effort: Pulmonary effort is normal. No respiratory distress.     Breath sounds: Normal breath sounds. No wheezing, rhonchi or rales.  Abdominal:     General: Bowel sounds are normal. There is no distension.     Palpations: Abdomen is soft. There is no mass.     Tenderness: There is no abdominal tenderness. There is no guarding or rebound.     Hernia: No hernia is present.  Musculoskeletal:        General: Normal range of motion.     Cervical back: Normal range of motion and neck supple.     Right lower leg: No edema.     Left lower leg: No edema.     Comments:  R knee WNL L knee exam: No deformity on inspection. No pain with palpation of knee landmarks. No effusion/swelling noted. FROM in flex/extension without crepitus. No popliteal fullness. Neg drawer test. Neg mcmurray test. No pain with valgus/varus stress. No PFgrind. No abnormal patellar mobility.   Lymphadenopathy:     Cervical: No cervical adenopathy.  Skin:    General: Skin is warm and dry.     Findings: No rash.  Neurological:     General: No focal deficit present.     Mental Status: He is alert and oriented to person, place, and time.  Psychiatric:        Mood and Affect: Mood normal.        Behavior: Behavior normal.        Thought Content: Thought content normal.        Judgment: Judgment normal.       Results for orders placed or performed in visit on 06/09/23  PSA  Result Value Ref Range   PSA 1.03 0.10 - 4.00 ng/mL  Basic metabolic panel  Result Value Ref Range   Sodium 140 135 - 145 mEq/L   Potassium 4.0  3.5 - 5.1 mEq/L   Chloride 105 96 - 112 mEq/L   CO2 27 19 - 32 mEq/L   Glucose, Bld 84 70 - 99 mg/dL   BUN 16 6 - 23 mg/dL   Creatinine, Ser 7.84 0.40 - 1.50 mg/dL   GFR 69.62 >95.28 mL/min   Calcium 8.8 8.4 - 10.5 mg/dL  Lipid panel  Result Value Ref Range   Cholesterol 162 0 - 200 mg/dL  Triglycerides 58.0 0.0 - 149.0 mg/dL   HDL 86.57 >84.69 mg/dL   VLDL 62.9 0.0 - 52.8 mg/dL   LDL Cholesterol 99 0 - 99 mg/dL   Total CHOL/HDL Ratio 3    NonHDL 110.35     Assessment & Plan:   Problem List Items Addressed This Visit     Health maintenance examination - Primary (Chronic)    Preventative protocols reviewed and updated unless pt declined. Discussed healthy diet and lifestyle.       Blood in stool    Chronic issue, attributed to internal hemorrhoids. Latest colonoscopy overall reassuring. He is interested in further evaluation/treatment of internal hemorroids.  Will refer back to GI.       Relevant Orders   Ambulatory referral to Gastroenterology   Left anterior knee pain    Chronic, present for 1+ year.  Reassuring exam. Suspect osteoarthritis developing.  Check baseline films today  Discussed OTC supplements that may be helpful.       Relevant Orders   DG Knee 4 Views W/Patella Left     No orders of the defined types were placed in this encounter.   Orders Placed This Encounter  Procedures   DG Knee 4 Views W/Patella Left    Standing Status:   Future    Number of Occurrences:   1    Standing Expiration Date:   06/15/2024    Order Specific Question:   Reason for Exam (SYMPTOM  OR DIAGNOSIS REQUIRED)    Answer:   chronic left knee pain eval arthritis, no trauma    Order Specific Question:   Preferred imaging location?    Answer:   [facility]   Ambulatory referral to Gastroenterology    Referral Priority:   Routine    Referral Type:   Consultation    Referral Reason:   Specialty Services Required    Number of Visits Requested:   1    Patient  Instructions  Si le interesa, puede hacer cita para nurse visit (visita con enfermera) para vacuna contra flu y vacuna contra shingles.  Rayos x de rodilla izquierda hoy  Trate suplementos que pueden ayudar salud de cartilago - vitamina D 1000 unidades diarias, o glucosamine/chondroitin, o tumeric anti inflamatorio natural - todos sin receta.  Gusto verlo hoy.  Regresar en 1 ao para proximo fisico.   Follow up plan: Return in about 1 year (around 06/15/2024) for annual exam, prior fasting for blood work.  Eustaquio Boyden, MD

## 2023-08-06 DIAGNOSIS — D3132 Benign neoplasm of left choroid: Secondary | ICD-10-CM | POA: Diagnosis not present

## 2024-01-31 IMAGING — CR DG FINGER INDEX 2+V*R*
1 series · 3 of 3 positions shown · non-contrast
Comparison: None.

CLINICAL DATA: Deep index finger laceration and pain.

EXAM:
RIGHT INDEX FINGER 2+V

[Series 1: dg finger index right · 0.14mm/px · 3 of 3 slices shown]
[im 1/3]
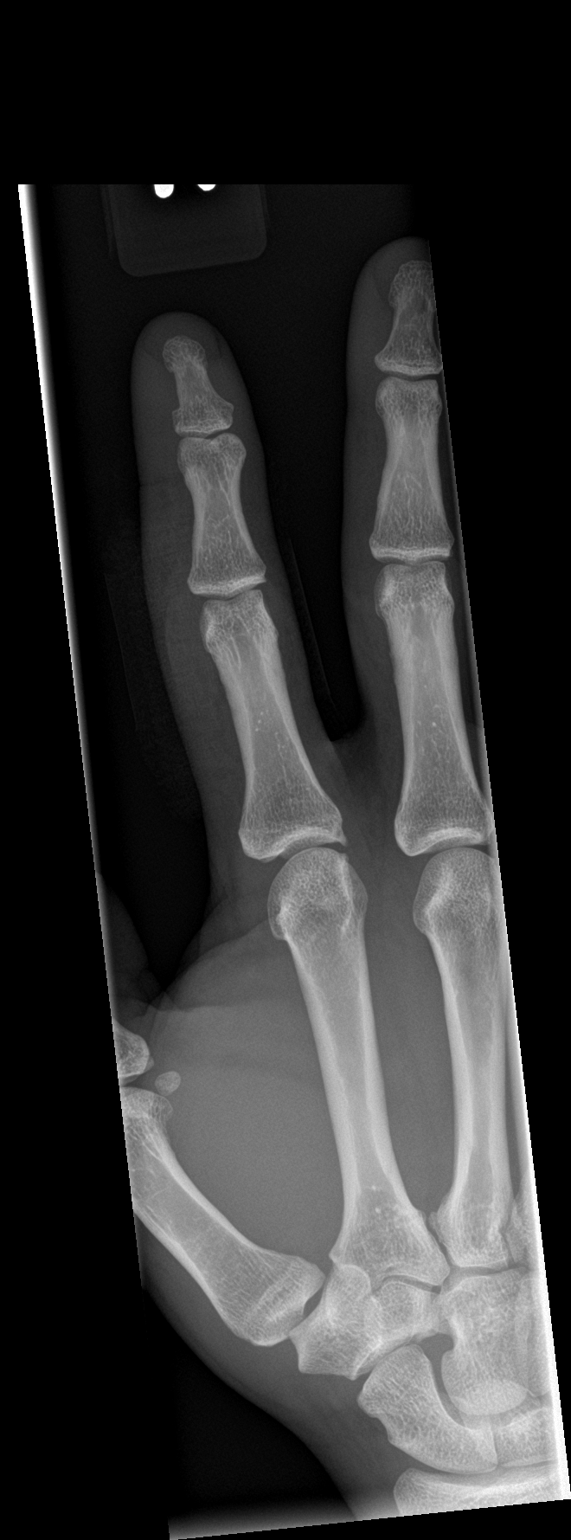
[im 2/3]
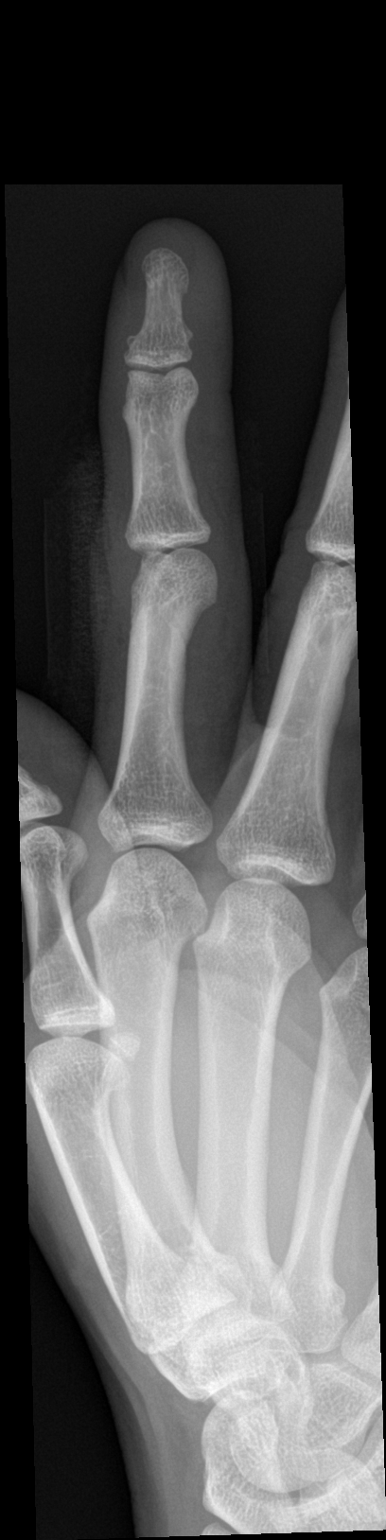
[im 3/3]
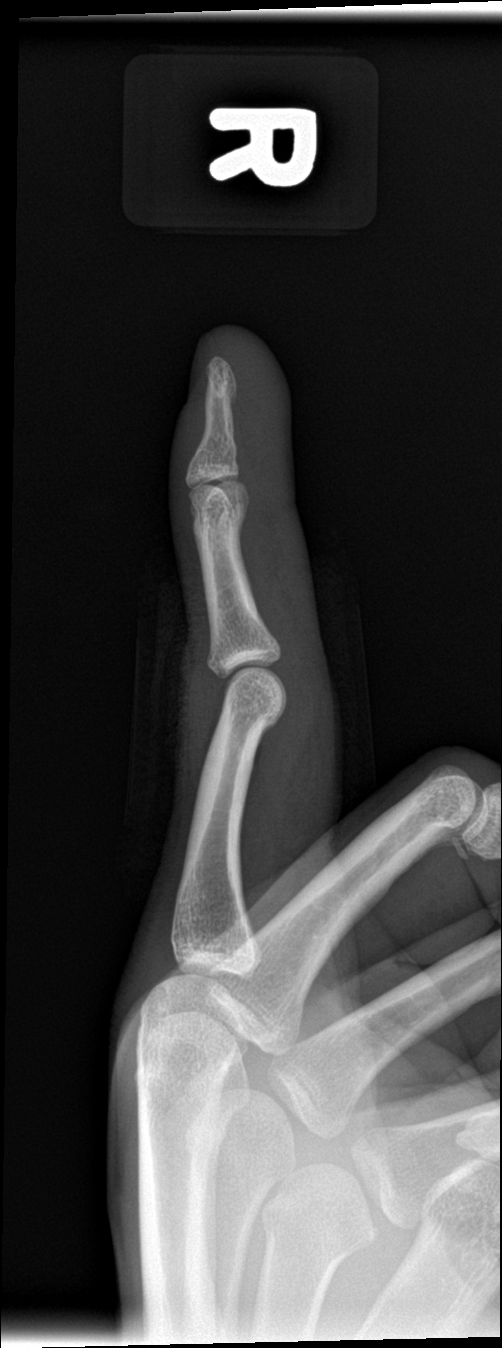

[3 of 3 positions shown; findings below may reference images not displayed]

FINDINGS: There is no evidence of fracture or dislocation. There is no
evidence of arthropathy or other focal bone abnormality. Mild
Boutonniere deformity is noted, which raises suspicion for extensor
tendon injury.
IMPRESSION: Mild Boutonniere deformity, which raises suspicion for extensor
tendon injury.

No evidence of fracture or dislocation.

## 2024-06-05 ENCOUNTER — Other Ambulatory Visit: Payer: Self-pay | Admitting: Family Medicine

## 2024-06-05 DIAGNOSIS — Z136 Encounter for screening for cardiovascular disorders: Secondary | ICD-10-CM

## 2024-06-05 DIAGNOSIS — Z125 Encounter for screening for malignant neoplasm of prostate: Secondary | ICD-10-CM

## 2024-06-05 DIAGNOSIS — Z131 Encounter for screening for diabetes mellitus: Secondary | ICD-10-CM

## 2024-06-05 NOTE — Progress Notes (Signed)
 The 10-year ASCVD risk score (Arnett DK, et al., 2019) is: 2.3%   Values used to calculate the score:     Age: 53 years     Clincally relevant sex: Male     Is Non-Hispanic African American: No     Diabetic: No     Tobacco smoker: No     Systolic Blood Pressure: 116 mmHg     Is BP treated: No     HDL Cholesterol: 51.8 mg/dL     Total Cholesterol: 162 mg/dL

## 2024-06-09 ENCOUNTER — Other Ambulatory Visit: Payer: 59

## 2024-06-11 ENCOUNTER — Other Ambulatory Visit: Payer: Self-pay | Admitting: Family Medicine

## 2024-06-11 NOTE — Addendum Note (Signed)
 Addended by: RILLA BALLER on: 06/11/2024 07:16 AM   Modules accepted: Orders

## 2024-06-14 ENCOUNTER — Ambulatory Visit: Payer: Self-pay | Admitting: Family Medicine

## 2024-06-14 ENCOUNTER — Other Ambulatory Visit (INDEPENDENT_AMBULATORY_CARE_PROVIDER_SITE_OTHER)

## 2024-06-14 DIAGNOSIS — Z136 Encounter for screening for cardiovascular disorders: Secondary | ICD-10-CM | POA: Diagnosis not present

## 2024-06-14 DIAGNOSIS — Z13220 Encounter for screening for lipoid disorders: Secondary | ICD-10-CM

## 2024-06-14 DIAGNOSIS — Z131 Encounter for screening for diabetes mellitus: Secondary | ICD-10-CM

## 2024-06-14 DIAGNOSIS — Z125 Encounter for screening for malignant neoplasm of prostate: Secondary | ICD-10-CM

## 2024-06-14 LAB — BASIC METABOLIC PANEL WITH GFR
BUN: 16 mg/dL (ref 6–23)
CO2: 27 meq/L (ref 19–32)
Calcium: 9.6 mg/dL (ref 8.4–10.5)
Chloride: 104 meq/L (ref 96–112)
Creatinine, Ser: 0.9 mg/dL (ref 0.40–1.50)
GFR: 98.67 mL/min (ref >60.00)
Glucose, Bld: 77 mg/dL (ref 70–99)
Potassium: 4.3 meq/L (ref 3.5–5.1)
Sodium: 139 meq/L (ref 135–145)

## 2024-06-14 LAB — LIPID PANEL
Cholesterol: 201 mg/dL — ABNORMAL HIGH (ref 0–200)
HDL: 58.5 mg/dL (ref >39.00)
LDL Cholesterol: 132 mg/dL — ABNORMAL HIGH (ref 0–99)
NonHDL: 142.68
Total CHOL/HDL Ratio: 3
Triglycerides: 55 mg/dL (ref 0.0–149.0)
VLDL: 11 mg/dL (ref 0.0–40.0)

## 2024-06-14 LAB — PSA: PSA: 0.79 ng/mL (ref 0.10–4.00)

## 2024-06-16 ENCOUNTER — Ambulatory Visit (INDEPENDENT_AMBULATORY_CARE_PROVIDER_SITE_OTHER): Payer: 59 | Admitting: Family Medicine

## 2024-06-16 ENCOUNTER — Encounter: Payer: Self-pay | Admitting: Family Medicine

## 2024-06-16 VITALS — BP 118/70 | HR 56 | Temp 98.1°F | Ht 67.5 in | Wt 168.5 lb

## 2024-06-16 DIAGNOSIS — K648 Other hemorrhoids: Secondary | ICD-10-CM

## 2024-06-16 DIAGNOSIS — M25562 Pain in left knee: Secondary | ICD-10-CM | POA: Diagnosis not present

## 2024-06-16 DIAGNOSIS — Z23 Encounter for immunization: Secondary | ICD-10-CM

## 2024-06-16 DIAGNOSIS — Z0000 Encounter for general adult medical examination without abnormal findings: Secondary | ICD-10-CM

## 2024-06-16 NOTE — Assessment & Plan Note (Signed)
 Preventative protocols reviewed and updated unless pt declined. Discussed healthy diet and lifestyle.

## 2024-06-16 NOTE — Progress Notes (Addendum)
 Ph: [phone] Fax: [phone]   Patient ID: Marcus [name], male    DOB: [birth year], 54 y.o.   MRN: [id number]  This visit was conducted in person.  BP 118/70   Pulse (!) 56   Temp 98.1 F (36.7 C) (Oral)   Ht 5' 7.5 (1.715 m)   Wt 168 lb 8 oz (76.4 kg)   SpO2 99%   BMI 26.00 kg/m   BP Readings from Last 3 Encounters:  06/16/24 118/70  06/16/23 116/68  06/10/22 120/72    Pulse Readings from Last 3 Encounters:  06/16/24 (!) 56  06/16/23 63  06/10/22 67   CC: CPE Subjective:   HPI: Marcus [name] is a 53 y.o. male presenting on 06/16/2024 for Annual Exam    Works 2 jobs - Surveyor, minerals side job.  Notes intermittent prolapsed internal hemorrhoidal bleed, mild. No constipation, stays active, good fiber and water in diet. Offered GI eval - he will consider.  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    Preventative: COLONOSCOPY 11/2019 - 2 small polyps, int hem, repeat 7 yrs ([facility]) Prostate cancer screening - no fmhx. No nocturia.  Lung cancer screen - not eligible  Flu shot - yearly  COVID vaccine - Pfizer 11/2019, 12/2019, booster 08/2020 Td 2010, Tdap 01/2020, again 2023  Prevnar-20 - discussed, to consider next year Shingrix - discussed - would be interested but will return for this Seat belt use discussed  Sunscreen use discussed, sun-shirts as well. Denies changing moles on skin.  Sleep - averaging 7-8 hours/night  Non smoker  Alcohol - occasional on weekends, socially  Dentist q6 mo  Eye exam yearly  Bowel - no constipation   Spanish speaking, from [place], [place]  Lives with wife and 2 sons and eddy  Occupation: works at [employer]  Edu: college - Print production planner  Activity: walking 2-3 mi/day  Diet: good water, fruits/vegetables daily      Relevant past medical, surgical, family and social history reviewed and updated as indicated. Interim medical history since our last visit reviewed. Allergies and medications reviewed  and updated. No outpatient medications prior to visit.   No facility-administered medications prior to visit.     Per HPI unless specifically indicated in ROS section below Review of Systems  Constitutional:  Negative for activity change, appetite change, chills, fatigue, fever and unexpected weight change.  HENT:  Negative for hearing loss.   Eyes:  Negative for visual disturbance.  Respiratory:  Negative for cough, chest tightness, shortness of breath and wheezing.   Cardiovascular:  Negative for chest pain, palpitations and leg swelling.  Gastrointestinal:  Positive for blood in stool (occ). Negative for abdominal distention, abdominal pain, constipation, diarrhea, nausea and vomiting.  Genitourinary:  Negative for difficulty urinating and hematuria.  Musculoskeletal:  Negative for arthralgias, myalgias and neck pain.  Skin:  Negative for rash.  Neurological:  Negative for dizziness, seizures, syncope and headaches.  Hematological:  Negative for adenopathy. Does not bruise/bleed easily.  Psychiatric/Behavioral:  Negative for dysphoric mood. The patient is not nervous/anxious.     Objective:  BP 118/70   Pulse (!) 56   Temp 98.1 F (36.7 C) (Oral)   Ht 5' 7.5 (1.715 m)   Wt 168 lb 8 oz (76.4 kg)   SpO2 99%   BMI 26.00 kg/m   Wt Readings from Last 3 Encounters:  06/16/24 168 lb 8 oz (76.4 kg)  06/16/23 164 lb 6 oz (74.6 kg)  06/10/22 168  lb 2 oz (76.3 kg)      Physical Exam Vitals and nursing note reviewed.  Constitutional:      General: He is not in acute distress.    Appearance: Normal appearance. He is well-developed. He is not ill-appearing.  HENT:     Head: Normocephalic and atraumatic.     Right Ear: Hearing, tympanic membrane, ear canal and external ear normal.     Left Ear: Hearing, tympanic membrane, ear canal and external ear normal.     Mouth/Throat:     Mouth: Mucous membranes are moist.     Pharynx: Oropharynx is clear. No oropharyngeal exudate or  posterior oropharyngeal erythema.  Eyes:     General: No scleral icterus.    Extraocular Movements: Extraocular movements intact.     Conjunctiva/sclera: Conjunctivae normal.     Pupils: Pupils are equal, round, and reactive to light.  Neck:     Thyroid : No thyroid  mass or thyromegaly.  Cardiovascular:     Rate and Rhythm: Normal rate and regular rhythm.     Pulses: Normal pulses.          Radial pulses are 2+ on the right side and 2+ on the left side.     Heart sounds: Normal heart sounds. No murmur heard. Pulmonary:     Effort: Pulmonary effort is normal. No respiratory distress.     Breath sounds: Normal breath sounds. No wheezing, rhonchi or rales.  Abdominal:     General: Bowel sounds are normal. There is no distension.     Palpations: Abdomen is soft. There is no mass.     Tenderness: There is no abdominal tenderness. There is no guarding or rebound.     Hernia: No hernia is present.  Musculoskeletal:        General: Normal range of motion.     Cervical back: Normal range of motion and neck supple.     Right lower leg: No edema.     Left lower leg: No edema.  Lymphadenopathy:     Cervical: No cervical adenopathy.  Skin:    General: Skin is warm and dry.     Findings: No rash.  Neurological:     General: No focal deficit present.     Mental Status: He is alert and oriented to person, place, and time.  Psychiatric:        Mood and Affect: Mood normal.        Behavior: Behavior normal.        Thought Content: Thought content normal.        Judgment: Judgment normal.       Results for orders placed or performed in visit on 06/14/24  Lipid panel   Collection Time: 06/14/24  9:51 AM  Result Value Ref Range   Cholesterol 201 (H) 0 - 200 mg/dL   Triglycerides 44.9 0.0 - 149.0 mg/dL   HDL 41.49 >60.99 mg/dL   VLDL 88.9 0.0 - 59.9 mg/dL   LDL Cholesterol 867 (H) 0 - 99 mg/dL   Total CHOL/HDL Ratio 3    NonHDL 142.68   Basic metabolic panel with GFR   Collection Time:  06/14/24  9:51 AM  Result Value Ref Range   Sodium 139 135 - 145 mEq/L   Potassium 4.3 3.5 - 5.1 mEq/L   Chloride 104 96 - 112 mEq/L   CO2 27 19 - 32 mEq/L   Glucose, Bld 77 70 - 99 mg/dL   BUN 16 6 - 23 mg/dL  Creatinine, Ser 0.90 0.40 - 1.50 mg/dL   GFR 01.32 >39.99 mL/min   Calcium 9.6 8.4 - 10.5 mg/dL  PSA   Collection Time: 06/14/24  9:51 AM  Result Value Ref Range   PSA 0.79 0.10 - 4.00 ng/mL    Assessment & Plan:   Problem List Items Addressed This Visit     Health maintenance examination - Primary (Chronic)   Preventative protocols reviewed and updated unless pt declined. Discussed healthy diet and lifestyle.       Internal hemorrhoid   Desires further eval of this - refer to GI      Relevant Orders   Ambulatory referral to Gastroenterology   Left anterior knee pain   Reviewed films from last visit -mild spurring to tibial spine Provided with knee strengthening exercises.  Update if ongoing for formal outpatient PT course.       Other Visit Diagnoses       Encounter for immunization       Relevant Orders   Flu vaccine trivalent PF, 6mos and older(Flulaval,Afluria,Fluarix,Fluzone) (Completed)        No orders of the defined types were placed in this encounter.   Orders Placed This Encounter  Procedures   Flu vaccine trivalent PF, 6mos and older(Flulaval,Afluria,Fluarix,Fluzone)   Ambulatory referral to Gastroenterology    Referral Priority:   Routine    Referral Type:   Consultation    Referral Reason:   Specialty Services Required    Number of Visits Requested:   1    Patient Instructions  Lo remitire a gastroenterologo a discutir hemrrhoide  Gusto verlo hoy Regresar en 1 ao para proximo examen fisico  Follow up plan: Return in about 1 year (around 06/16/2025) for annual exam, prior fasting for blood work.  Anton Blas, MD

## 2024-06-16 NOTE — Assessment & Plan Note (Signed)
 Desires further eval of this - refer to GI

## 2024-06-16 NOTE — Assessment & Plan Note (Signed)
 Reviewed films from last visit -mild spurring to tibial spine Provided with knee strengthening exercises.  Update if ongoing for formal outpatient PT course.

## 2024-06-16 NOTE — Patient Instructions (Addendum)
 Lo remitire a Programmer, applications a Advertising account planner en 1 ao para proximo examen fisico

## 2024-09-28 ENCOUNTER — Encounter: Payer: Self-pay | Admitting: Internal Medicine

## 2025-06-13 ENCOUNTER — Other Ambulatory Visit

## 2025-06-20 ENCOUNTER — Encounter: Admitting: Family Medicine
